# Patient Record
Sex: Female | Born: 1945
Health system: Southern US, Community
[De-identification: ages and names within clinical notes are randomized; demographics above are authoritative.]

## PROBLEM LIST (undated history)

## (undated) DIAGNOSIS — H269 Unspecified cataract: Secondary | ICD-10-CM

## (undated) DIAGNOSIS — K219 Gastro-esophageal reflux disease without esophagitis: Secondary | ICD-10-CM

## (undated) DIAGNOSIS — K635 Polyp of colon: Secondary | ICD-10-CM

## (undated) DIAGNOSIS — M1990 Unspecified osteoarthritis, unspecified site: Secondary | ICD-10-CM

## (undated) DIAGNOSIS — B029 Zoster without complications: Secondary | ICD-10-CM

## (undated) DIAGNOSIS — K449 Diaphragmatic hernia without obstruction or gangrene: Secondary | ICD-10-CM

## (undated) DIAGNOSIS — K5790 Diverticulosis of intestine, part unspecified, without perforation or abscess without bleeding: Secondary | ICD-10-CM

## (undated) DIAGNOSIS — E782 Mixed hyperlipidemia: Secondary | ICD-10-CM

## (undated) DIAGNOSIS — T451X5A Adverse effect of antineoplastic and immunosuppressive drugs, initial encounter: Secondary | ICD-10-CM

## (undated) DIAGNOSIS — C801 Malignant (primary) neoplasm, unspecified: Secondary | ICD-10-CM

## (undated) HISTORY — DX: Gastro-esophageal reflux disease without esophagitis: K21.9

## (undated) HISTORY — PX: JOINT REPLACEMENT: SHX530

## (undated) HISTORY — DX: Mixed hyperlipidemia: E78.2

## (undated) HISTORY — PX: KNEE SURGERY: SHX244

## (undated) HISTORY — DX: Malignant (primary) neoplasm, unspecified: C80.1

## (undated) HISTORY — PX: TUBAL LIGATION: SHX77

## (undated) HISTORY — DX: Polyp of colon: K63.5

## (undated) HISTORY — DX: Diaphragmatic hernia without obstruction or gangrene: K44.9

## (undated) HISTORY — DX: Adverse effect of antineoplastic and immunosuppressive drugs, initial encounter: T45.1X5A

## (undated) HISTORY — DX: Unspecified cataract: H26.9

## (undated) HISTORY — DX: Unspecified osteoarthritis, unspecified site: M19.90

## (undated) HISTORY — DX: Zoster without complications: B02.9

## (undated) HISTORY — DX: Diverticulosis of intestine, part unspecified, without perforation or abscess without bleeding: K57.90

---

## 1987-12-26 DIAGNOSIS — Z853 Personal history of malignant neoplasm of breast: Secondary | ICD-10-CM

## 1987-12-26 DIAGNOSIS — C801 Malignant (primary) neoplasm, unspecified: Secondary | ICD-10-CM

## 1987-12-26 HISTORY — PX: BREAST SURGERY: SHX581

## 1987-12-26 HISTORY — DX: Malignant (primary) neoplasm, unspecified: C80.1

## 1987-12-26 HISTORY — DX: Personal history of malignant neoplasm of breast: Z85.3

## 1992-12-25 HISTORY — PX: OTHER SURGICAL HISTORY: SHX169

## 1999-05-06 ENCOUNTER — Ambulatory Visit (HOSPITAL_COMMUNITY): Admission: RE | Admit: 1999-05-06 | Discharge: 1999-05-06 | Payer: Self-pay | Admitting: Hematology & Oncology

## 1999-05-06 ENCOUNTER — Encounter: Payer: Self-pay | Admitting: Hematology & Oncology

## 1999-10-19 ENCOUNTER — Other Ambulatory Visit: Admission: RE | Admit: 1999-10-19 | Discharge: 1999-10-19 | Payer: Self-pay | Admitting: Obstetrics and Gynecology

## 2000-11-13 ENCOUNTER — Other Ambulatory Visit: Admission: RE | Admit: 2000-11-13 | Discharge: 2000-11-13 | Payer: Self-pay | Admitting: Obstetrics and Gynecology

## 2001-07-29 ENCOUNTER — Ambulatory Visit (HOSPITAL_COMMUNITY): Admission: RE | Admit: 2001-07-29 | Discharge: 2001-07-29 | Payer: Self-pay | Admitting: Gastroenterology

## 2001-07-29 ENCOUNTER — Encounter (INDEPENDENT_AMBULATORY_CARE_PROVIDER_SITE_OTHER): Payer: Self-pay | Admitting: Specialist

## 2001-11-24 DIAGNOSIS — K635 Polyp of colon: Secondary | ICD-10-CM

## 2001-11-24 HISTORY — DX: Polyp of colon: K63.5

## 2002-01-17 ENCOUNTER — Other Ambulatory Visit: Admission: RE | Admit: 2002-01-17 | Discharge: 2002-01-17 | Payer: Self-pay | Admitting: Obstetrics and Gynecology

## 2003-02-10 ENCOUNTER — Other Ambulatory Visit: Admission: RE | Admit: 2003-02-10 | Discharge: 2003-02-10 | Payer: Self-pay | Admitting: Obstetrics and Gynecology

## 2004-03-02 ENCOUNTER — Other Ambulatory Visit: Admission: RE | Admit: 2004-03-02 | Discharge: 2004-03-02 | Payer: Self-pay | Admitting: Obstetrics and Gynecology

## 2004-12-21 ENCOUNTER — Ambulatory Visit (HOSPITAL_BASED_OUTPATIENT_CLINIC_OR_DEPARTMENT_OTHER): Admission: RE | Admit: 2004-12-21 | Discharge: 2004-12-21 | Payer: Self-pay | Admitting: Orthopedic Surgery

## 2004-12-21 ENCOUNTER — Ambulatory Visit (HOSPITAL_COMMUNITY): Admission: RE | Admit: 2004-12-21 | Discharge: 2004-12-21 | Payer: Self-pay | Admitting: Orthopedic Surgery

## 2005-01-25 HISTORY — PX: OTHER SURGICAL HISTORY: SHX169

## 2005-02-02 ENCOUNTER — Ambulatory Visit (HOSPITAL_BASED_OUTPATIENT_CLINIC_OR_DEPARTMENT_OTHER): Admission: RE | Admit: 2005-02-02 | Discharge: 2005-02-02 | Payer: Self-pay | Admitting: Orthopedic Surgery

## 2005-03-08 ENCOUNTER — Other Ambulatory Visit: Admission: RE | Admit: 2005-03-08 | Discharge: 2005-03-08 | Payer: Self-pay | Admitting: Obstetrics and Gynecology

## 2005-12-25 DIAGNOSIS — K5790 Diverticulosis of intestine, part unspecified, without perforation or abscess without bleeding: Secondary | ICD-10-CM

## 2005-12-25 HISTORY — DX: Diverticulosis of intestine, part unspecified, without perforation or abscess without bleeding: K57.90

## 2005-12-25 HISTORY — PX: COLONOSCOPY: SHX174

## 2005-12-25 LAB — HM COLONOSCOPY

## 2006-04-09 ENCOUNTER — Other Ambulatory Visit: Admission: RE | Admit: 2006-04-09 | Discharge: 2006-04-09 | Payer: Self-pay | Admitting: Obstetrics and Gynecology

## 2008-08-25 HISTORY — PX: ROTATOR CUFF REPAIR: SHX139

## 2008-09-01 ENCOUNTER — Other Ambulatory Visit: Admission: RE | Admit: 2008-09-01 | Discharge: 2008-09-01 | Payer: Self-pay | Admitting: Obstetrics and Gynecology

## 2008-09-01 LAB — HM PAP SMEAR: HM Pap smear: NORMAL

## 2008-11-24 HISTORY — PX: REPLACEMENT TOTAL KNEE: SUR1224

## 2008-12-21 ENCOUNTER — Inpatient Hospital Stay (HOSPITAL_COMMUNITY): Admission: RE | Admit: 2008-12-21 | Discharge: 2008-12-23 | Payer: Self-pay | Admitting: Orthopedic Surgery

## 2010-12-18 IMAGING — CR DG CHEST 2V
2 series · 2 of 2 positions shown · non-contrast
Comparison: None

CLINICAL DATA: Preadmission radiograph

CHEST - 2 VIEW

[view not recorded (1 of 2)]
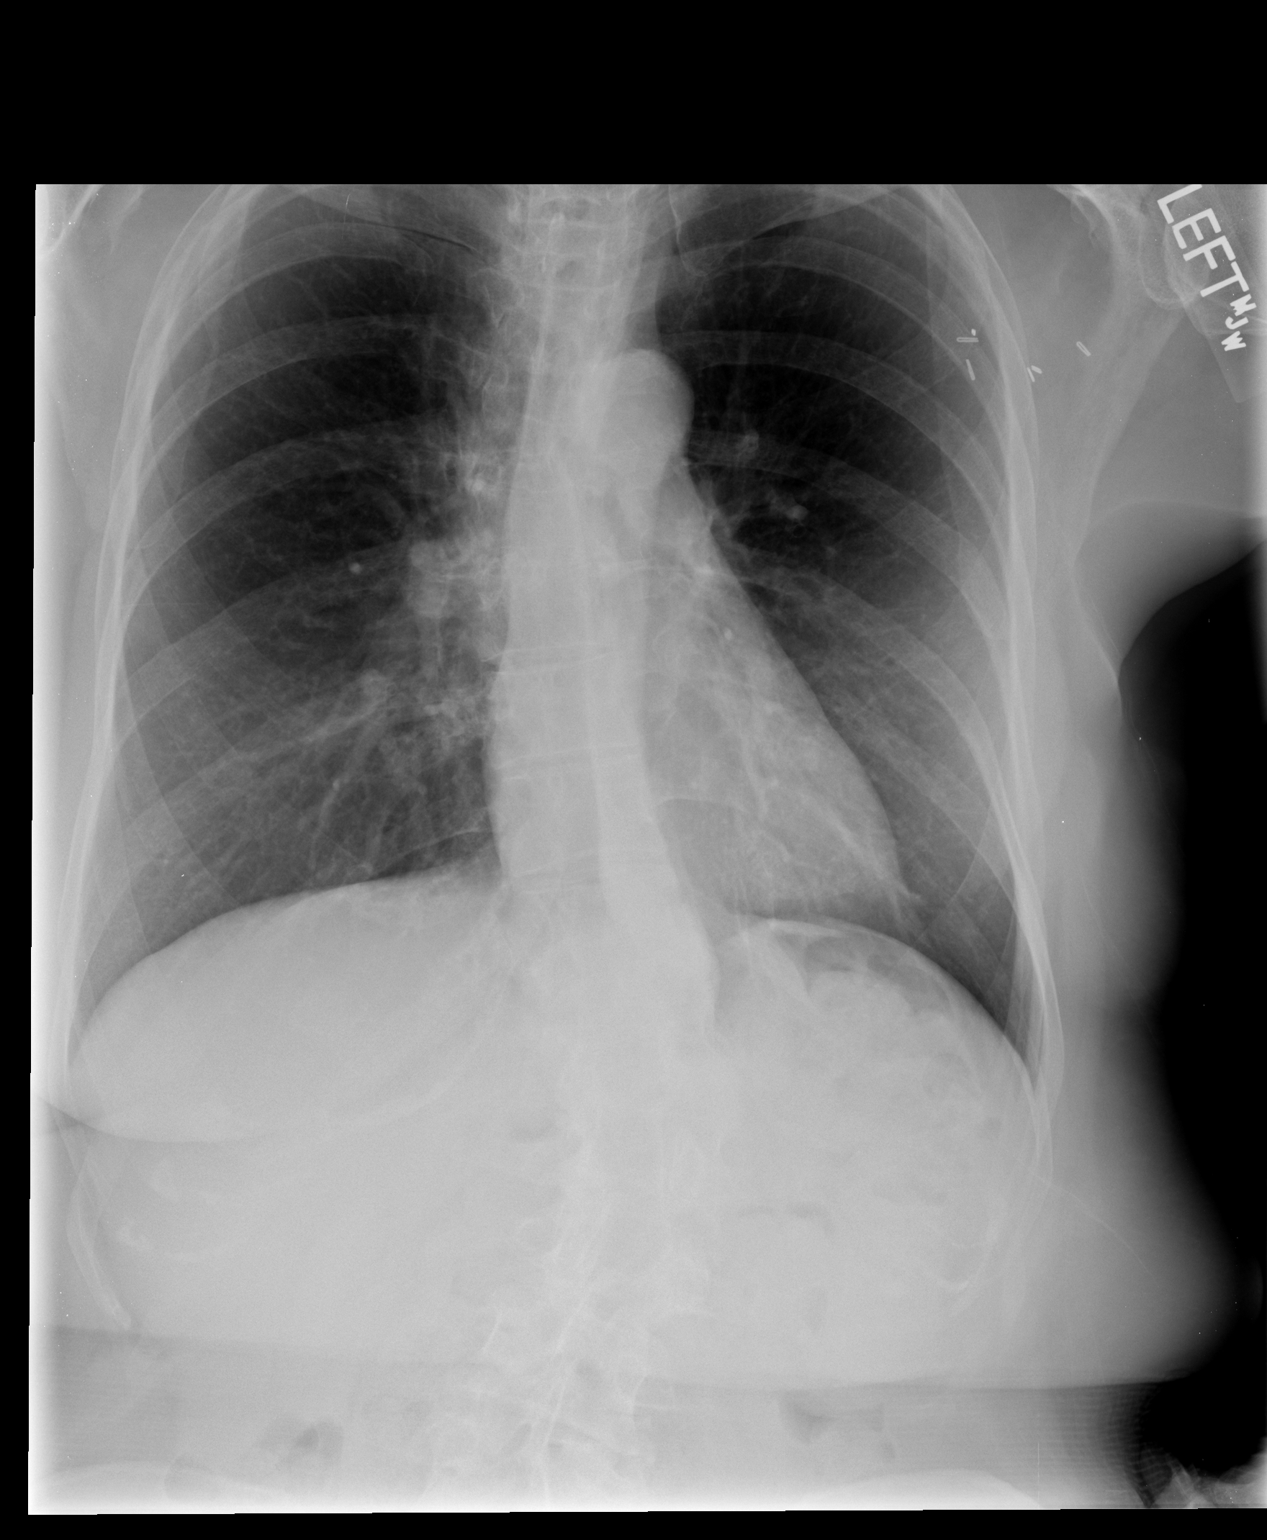

[view not recorded (2 of 2)]
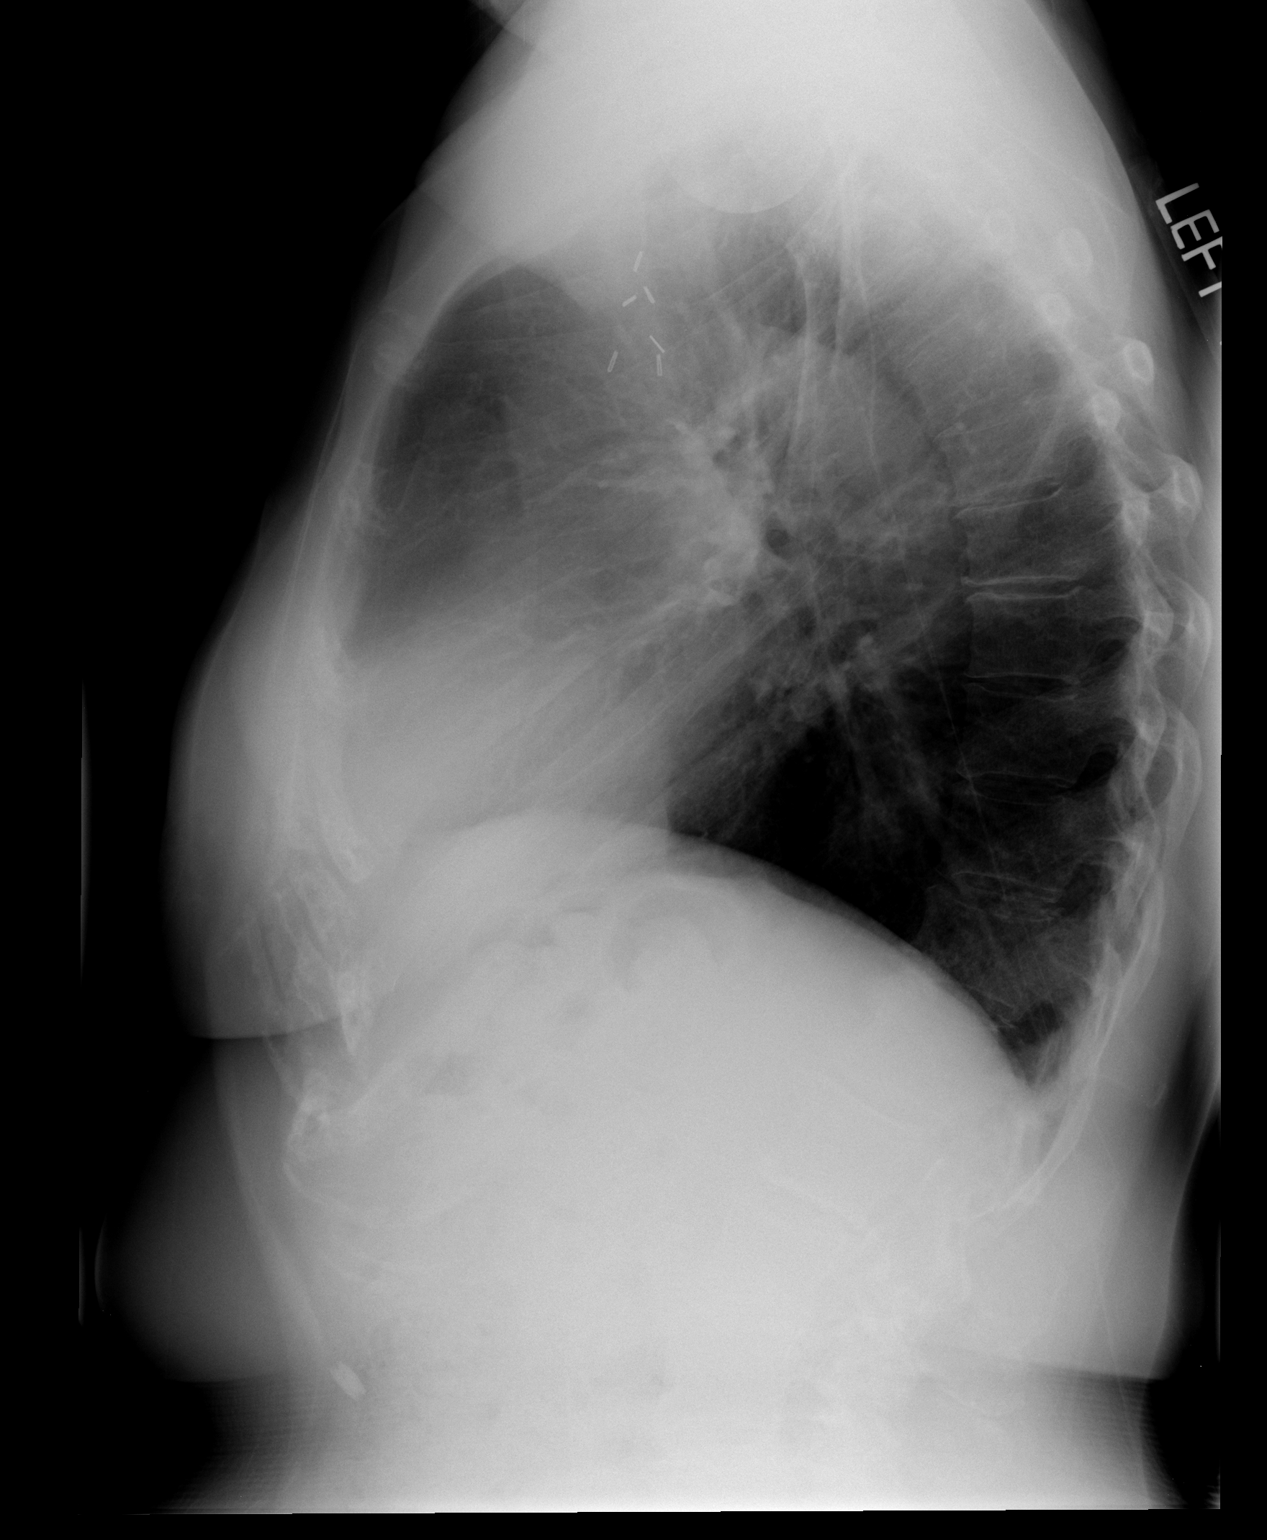

[2 of 2 positions shown; findings below may reference images not displayed]

FINDINGS: The heart size and mediastinal contours are within normal
limits.  Both lungs are clear.  The visualized skeletal structures
are unremarkable.
IMPRESSION: No acute cardiopulmonary abnormalities.

## 2011-05-09 NOTE — Op Note (Signed)
NAMEBRYNLIE, DAZA                ACCOUNT NO.:  [id number]   MEDICAL RECORD NO.:  [id number]          PATIENT TYPE:  INP   LOCATION:  5022                         FACILITY:  [facility]   PHYSICIAN:  Mila Homer. Sherlean Foot, M.D. DATE OF BIRTH:  [birth year]   DATE OF PROCEDURE:  12/21/2008  DATE OF DISCHARGE:                               OPERATIVE REPORT   SURGEON:  Mila Homer. Sherlean Foot, MD   ASSISTANT:  1. Altamese Cabal, PA-C  2. Laural Benes. Su Hilt, PA-C   ANESTHESIA:  General.   PREOPERATIVE DIAGNOSIS:  Right failed unicompartmental arthroplasty.   POSTOPERATIVE DIAGNOSIS:  Right failed unicompartmental arthroplasty.   PROCEDURE:  Right revision total knee arthroplasty.   INDICATIONS FOR PROCEDURE:  The patient is a 65 year old white female  status post UniSpacer.  Conservative measures failed.  Informed consent  was obtained.   DESCRIPTION OF PROCEDURE:  The patient was laid supine, administered  general anesthesia, and Foley catheter placed.  Right leg was prepped  and draped in usual sterile fashion.  The extremity was exsanguinated  with the Esmarch and elevated to 350 mmHg.  I then made a midline  incision using her old incision, it was a rather curvilinear incision  over the medial side of the patella.  New blade was used make a medial  parapatellar arthrotomy to perform synovectomy.  I elevated deep MCL off  the medial crest of the tibia to expose the knee.  I then everted the  patella measured 22 mm thick, I reamed down 8.5 mm in length through lug  holes and drilled, and then with the 32-mm template in place, I had  recreated the 22 mm thickness.  I then removed the trial component went  into flexion subluxing the patella laterally.  I used the extramedullary  alignment system on the tibia to make a perpendicular cut to the  anatomic axis of the tibia.  I then cut the ACL, PCL, and made an  intramedullary drill hole on the femur and cut the distal femur to 6  degrees of valgus.  I  marked out the epicondylar axis measured to a size  D, pinned through the 3-degree external rotation holes and made the  anterior, posterior, chamfer cuts.  I then removed the 4-in-1 cutting  block and cut surfaces of the bone.  I placed a lamina spreader in the  knee, removed the ACL, PCL, medial lateral menisci, and posterior  condylar osteophytes.  I then placed spacers in the knee and obtained  flexion/extension gap balance with a 14-spacer block.  I then finished  the femur with a size D finishing block, finished the tibia with size 4  tibial tray drilling keel.  I did have to bone graft the cyst in the  medial femoral condyle.  There was  approximately 2-3 mL of bone graft,  which was autogenous from the cut surfaces of the bone.  I then cemented  in the size D femur and size 4 tibia, removed all excess cement, snapped  in the 14 polyethylene insert and let the cement harden in extension.  Cemented in  the patella and removed excess cement.  We placed a Hemovac  coming out superolaterally with deep to the arthrotomy.  Pain catheter  coming out supermedial and superficial to the arthrotomy.  I injected  capsule with 4 cc of Marcaine 0.25%.  Then I let the tourniquet down and  obtained hemostasis after copiously irrigated.  I then closed the  arthrotomy with figure-of-eight #1 Vicryl sutures, buried 0 Vicryl  sutures, subcuticular 2-0 Vicryl, Steri-Strips, and skin staples.   DRESSINGS:  Xeroform dressing sponges, sterile Webril, TED stockings.   COMPLICATIONS:  None.   DRAINS:  One PEG catheter and one Hemovac.   ESTIMATED BLOOD LOSS:  300 mL.           ______________________________  Mila Homer. Sherlean Foot, M.D.     SDL/MEDQ  D:  12/21/2008  T:  12/21/2008  Job:  [id number]

## 2011-05-12 NOTE — Procedures (Signed)
[facility]  Patient:    Melanie Hamilton, Melanie Hamilton                       MRN: [id number] Proc. Date: 07/29/01 Adm. Date:  [id number] Attending:  Louie Bun                           Procedure Report  PROCEDURE:  Colonoscopy with polypectomy.  INDICATION FOR PROCEDURE:  History of adenomatous colon polyps with index colonoscopy three years ago.  DESCRIPTION OF PROCEDURE:  The patient was placed in the left lateral decubitus position and placed on the pulse monitor with continuous low-flow oxygen delivered by nasal cannula.  She was sedated with 25 mg IV Demerol and 2 mg IV Versed in addition to the medicine given for the EGD immediately before this procedure.  The Olympus video colonoscope was inserted into the rectum and advanced to the cecum, confirmed by transillumination at McBurneys point and visualization of the ileocecal valve and appendiceal orifice.  The prep was excellent.  The cecum, ascending, transverse, descending, and sigmoid colon all appeared normal with the exception of a small 6 mm sessile polyp of the mid ascending colon which was fulgurated by hot biopsy.  The rectum appeared normal, and retroflex view of the anus revealed no obvious internal hemorrhoids.  The colonoscope was then withdrawn, and the patient returned to the recovery room in stable condition.  She tolerated the procedure well, and there were no immediate complications.  IMPRESSION:  Small ascending colon polyp.  Otherwise normal colonoscopy.  PLAN:  Await histology and will probably perform a repeat colonoscopy in five years. DD:  07/29/01 TD:  07/29/01 Job: [id number]

## 2011-05-12 NOTE — Op Note (Signed)
NAMEZANOBIA, GRIEBEL                ACCOUNT NO.:  [id number]   MEDICAL RECORD NO.:  [id number]          PATIENT TYPE:  AMB   LOCATION:  DSC                          FACILITY:  [facility]   PHYSICIAN:  Nadara Mustard, MD     DATE OF BIRTH:  02/09/1946   DATE OF PROCEDURE:  02/02/2005  DATE OF DISCHARGE:                                 OPERATIVE REPORT   PREOPERATIVE DIAGNOSES:  1.  Severe hallux valgus deformity of the left great toe.  2.  Clawing of the left second toe with metatarsalgia and plantar callus.  3.  Plantar wart, left fifth metatarsal head.   PROCEDURES:  1.  Ludloff osteotomy, left great toe, first metatarsal.  2.  Weil osteotomy of the left second metatarsal.  3.  Aiken osteotomy, proximal phalanx, left great toe.  4.  Excision of plantar wart.   SURGEON:  Nadara Mustard, MD   ANESTHESIA:  Ankle block.   ESTIMATED BLOOD LOSS:  Minimal.   ANTIBIOTICS:  1 g Kefzol.   TOURNIQUET TIME:  Esmarch at the ankle for approximately 45 minutes.   DISPOSITION:  To PACU in stable condition.   INDICATION FOR PROCEDURE:  The patient is a 65 year old woman who has a  painful and severe hallux valgus deformity of the left great toe.  She has a  hallux valgus angle of 50 degrees with a noncongruent MTP joint as well as  an intermetatarsal angle of 15 degrees with overlapping of the second toe  over the great toe.  The patient has failed conservative care and presents  at this time for surgical intervention.  The risks and benefits were  discussed, including infection, neurovascular injury, persistent pain,  fracture of the bone, and need for additional surgery.  The patient states  she understands and wishes to proceed at this time.   DESCRIPTION OF PROCEDURE:  The patient underwent an ankle block and then was  brought to OR room 5.  After an adequate level of anesthesia obtained, the  patient's left lower extremity was prepped using DuraPrep and draped into a  sterile field.   Attention was first focused over the first metatarsal.  The  leg was elevated and an Esmarch was wrapped around the ankle for tourniquet  control.  A medial longitudinal incision was made over the first metatarsal.  This was carried down to the MTP joint.  A football shape of the retinaculum  was ellipsed out of the plantar aspect to allow for reduction of the  sesamoids.  An ostectomy was performed, and then a Ludloff ostectomy was  performed.  This was secured proximally with a 3.5 cortical screw in a lag  screw technique.  The deformity was corrected, and this was secured distally  with a second lag screw, a 3.5 cortical screw.  An ostectomy was performed  to remove the medial prominence of bone.  Attention was then focused over  the second metatarsal.  An incision was made over the second metatarsal head  MTP joint.  Sharp dissection was carried down to the joint.  Retractors were  placed.  A Weil osteotomy w performed, and this was stabilized with a 12 mm  spin screw.  Attention was focused in the first web space, and the adductor  tendons as well as the capsule were released to allow for reduction of the  sesamoids.  The tourniquet was deflated after 45 minutes, hemostasis was  obtained.  A 2-0 Vicryl was used to repair the retinaculum.  The alignment  of the toes showed there to still be some valgus deformity of the great toe,  and an Aiken osteotomy was performed over the proximal phalanx and this was  held stabilized with a 0.0625 K-wire.  This corrected her hallux valgus  deformity.  The retinaculum was closed using the 2-0 Vicryl.  The subcu was  closed using 2-0 Vicryl.  The skin was closed using a far-near, near-far  suture with 3-0 nylon.  The wound was covered with Adaptic.  Attention was  then focused over the fifth metatarsal head.  The plantar wart was excised  in one block of tissue.  This was then covered with Adaptic as well.  All  wounds were then covered with 4 x 4's,  Webril and a Coban dressing.  The  patient was taken to the PACU in stable condition.  She was comfortable.  She was discharged to home.  A prescription for Vicodin for pain, rest, ice,  elevation, nonweightbearing on the left lower extremity, follow-up in the  office on Friday to change the dressing, in two weeks for repeat evaluation.      MVD/MEDQ  D:  02/02/2005  T:  02/02/2005  Job:  [id number]

## 2011-05-12 NOTE — Procedures (Signed)
[facility]  Patient:    Melanie Hamilton, Melanie Hamilton                       MRN: [id number] Proc. Date: 07/29/01 Adm. Date:  [id number] Attending:  Louie Bun                           Procedure Report  PROCEDURE:  Esophagogastroduodenoscopy with biopsy.  INDICATION FOR PROCEDURE:  Patient scheduled for surveillance colonoscopy, has a long history of gastroesophageal reflux symptoms with recent increase in symptoms not adequately controlled on Prilosec.  DESCRIPTION OF PROCEDURE:   The patient was placed in the left lateral decubitus position and placed on the pulse monitor with continuous low-flow oxygen delivered by nasal cannula.  She was sedated with 75 mg IV Demerol and 6.5 mg IV Versed.  The Olympus video endoscope was advanced under direct vision into the oropharynx and esophagus.  The esophagus was somewhat tortuous but of normal caliber with the squamocolumnar line at 38 cm.  There were 3-4 discrete erosions with some exudate extending about 2 cm up from the Z-line consistent with grade 3 erosive esophagitis.  It was difficult to discern whether there was a ring or stricture, but none was apparent.  There was a 3 cm hiatal hernia distal to the squamocolumnar line.  The stomach was entered, and a small amount of liquid secretions were suctioned from the fundus.  Retroflex view of the cardia confirmed a hiatal hernia and was otherwise unremarkable.  The fundus, body, antrum, and pylorus all appeared normal.  The duodenum was entered, and both the bulb and the second portion were well inspected and appeared to be within normal limits.  The scope was then withdrawn, and the patient returned to the recovery room in stable condition.  She tolerated the procedure well, and there were no immediate complications.  IMPRESSION:  Persistent erosive esophagitis with hiatal hernia.  PLAN:  Will change from Prilosec to Nexium 40 mg b.i.d. for two months and then  daily. DD:  07/29/01 TD:  07/29/01 Job: [id number]

## 2011-09-29 LAB — URINE CULTURE

## 2011-09-29 LAB — COMPREHENSIVE METABOLIC PANEL
ALT: 18 U/L (ref 0–35)
AST: 20 U/L (ref 0–37)
Albumin: 4.1 g/dL (ref 3.5–5.2)
Alkaline Phosphatase: 101 U/L (ref 39–117)
BUN: 19 mg/dL (ref 6–23)
CO2: 25 mEq/L (ref 19–32)
Calcium: 9.8 mg/dL (ref 8.4–10.5)
Chloride: 103 mEq/L (ref 96–112)
Creatinine, Ser: 0.77 mg/dL (ref 0.4–1.2)
GFR calc Af Amer: >60 mL/min (ref >60)
GFR calc non Af Amer: >60 mL/min (ref >60)
Glucose, Bld: 97 mg/dL (ref 70–99)
Potassium: 4.1 mEq/L (ref 3.5–5.1)
Sodium: 137 mEq/L (ref 135–145)
Total Bilirubin: 0.7 mg/dL (ref 0.3–1.2)
Total Protein: 6.5 g/dL (ref 6.0–8.3)

## 2011-09-29 LAB — CBC
HCT: 40.7 % (ref 36.0–46.0)
Hemoglobin: 13.8 g/dL (ref 12.0–15.0)
Hemoglobin: 9.3 g/dL — ABNORMAL LOW (ref 12.0–15.0)
MCHC: 34 g/dL (ref 30.0–36.0)
MCHC: 34.1 g/dL (ref 30.0–36.0)
MCV: 94.3 fL (ref 78.0–100.0)
Platelets: 205 10*3/uL (ref 150–400)
Platelets: 252 10*3/uL (ref 150–400)
RBC: 2.85 MIL/uL — ABNORMAL LOW (ref 3.87–5.11)
RBC: 3.16 MIL/uL — ABNORMAL LOW (ref 3.87–5.11)
RBC: 4.32 MIL/uL (ref 3.87–5.11)
RDW: 12.2 % (ref 11.5–15.5)
RDW: 12.2 % (ref 11.5–15.5)
WBC: 4.5 10*3/uL (ref 4.0–10.5)

## 2011-09-29 LAB — DIFFERENTIAL
Basophils Absolute: 0 10*3/uL (ref 0.0–0.1)
Basophils Relative: 0 % (ref 0–1)
Eosinophils Absolute: 0.1 10*3/uL (ref 0.0–0.7)
Eosinophils Relative: 2 % (ref 0–5)
Lymphocytes Relative: 34 % (ref 12–46)
Lymphs Abs: 1.5 10*3/uL (ref 0.7–4.0)
Monocytes Absolute: 0.4 10*3/uL (ref 0.1–1.0)
Monocytes Relative: 9 % (ref 3–12)
Neutro Abs: 2.5 10*3/uL (ref 1.7–7.7)
Neutrophils Relative %: 54 % (ref 43–77)

## 2011-09-29 LAB — CROSSMATCH: ABO/RH(D): O POS

## 2011-09-29 LAB — BASIC METABOLIC PANEL
BUN: 8 mg/dL (ref 6–23)
CO2: 25 mEq/L (ref 19–32)
Calcium: 8.2 mg/dL — ABNORMAL LOW (ref 8.4–10.5)
Calcium: 8.5 mg/dL (ref 8.4–10.5)
Creatinine, Ser: 0.89 mg/dL (ref 0.4–1.2)
GFR calc Af Amer: >60 mL/min (ref >60)
GFR calc Af Amer: >60 mL/min (ref >60)
GFR calc non Af Amer: >60 mL/min (ref >60)
Glucose, Bld: 103 mg/dL — ABNORMAL HIGH (ref 70–99)
Sodium: 136 mEq/L (ref 135–145)

## 2011-09-29 LAB — URINALYSIS, ROUTINE W REFLEX MICROSCOPIC
Hgb urine dipstick: NEGATIVE
Protein, ur: NEGATIVE mg/dL
Urobilinogen, UA: 0.2 mg/dL (ref 0.0–1.0)

## 2011-09-29 LAB — URINE MICROSCOPIC-ADD ON

## 2011-09-29 LAB — PROTIME-INR
INR: 1 (ref 0.00–1.49)
Prothrombin Time: 13.6 seconds (ref 11.6–15.2)

## 2012-10-28 DIAGNOSIS — Z1231 Encounter for screening mammogram for malignant neoplasm of breast: Secondary | ICD-10-CM | POA: Diagnosis not present

## 2012-10-28 LAB — HM MAMMOGRAPHY: HM Mammogram: NORMAL

## 2013-01-16 DIAGNOSIS — Z23 Encounter for immunization: Secondary | ICD-10-CM | POA: Diagnosis not present

## 2013-05-14 DIAGNOSIS — B029 Zoster without complications: Secondary | ICD-10-CM | POA: Diagnosis not present

## 2013-05-23 DIAGNOSIS — E663 Overweight: Secondary | ICD-10-CM | POA: Diagnosis not present

## 2013-05-23 DIAGNOSIS — K219 Gastro-esophageal reflux disease without esophagitis: Secondary | ICD-10-CM | POA: Diagnosis not present

## 2013-05-23 DIAGNOSIS — Z13220 Encounter for screening for lipoid disorders: Secondary | ICD-10-CM | POA: Diagnosis not present

## 2013-05-23 DIAGNOSIS — Z79899 Other long term (current) drug therapy: Secondary | ICD-10-CM | POA: Diagnosis not present

## 2013-05-23 DIAGNOSIS — B028 Zoster with other complications: Secondary | ICD-10-CM | POA: Diagnosis not present

## 2013-05-26 ENCOUNTER — Encounter: Payer: Self-pay | Admitting: *Deleted

## 2013-05-27 ENCOUNTER — Encounter: Payer: Self-pay | Admitting: Nurse Practitioner

## 2013-05-27 ENCOUNTER — Ambulatory Visit (INDEPENDENT_AMBULATORY_CARE_PROVIDER_SITE_OTHER): Payer: Medicare Other | Admitting: Nurse Practitioner

## 2013-05-27 VITALS — BP 132/68 | HR 74 | Ht 61.0 in | Wt 190.0 lb

## 2013-05-27 DIAGNOSIS — Z124 Encounter for screening for malignant neoplasm of cervix: Secondary | ICD-10-CM

## 2013-05-27 DIAGNOSIS — C50919 Malignant neoplasm of unspecified site of unspecified female breast: Secondary | ICD-10-CM | POA: Diagnosis not present

## 2013-05-27 DIAGNOSIS — Z01419 Encounter for gynecological examination (general) (routine) without abnormal findings: Secondary | ICD-10-CM | POA: Diagnosis not present

## 2013-05-27 DIAGNOSIS — C50912 Malignant neoplasm of unspecified site of left female breast: Secondary | ICD-10-CM | POA: Insufficient documentation

## 2013-05-27 DIAGNOSIS — Z1239 Encounter for other screening for malignant neoplasm of breast: Secondary | ICD-10-CM | POA: Diagnosis not present

## 2013-05-27 MED ORDER — RALOXIFENE HCL 60 MG PO TABS: 60.0000 mg | ORAL_TABLET | Freq: Every day | ORAL | Status: DC

## 2013-05-27 NOTE — Progress Notes (Signed)
67 y.o. G2P2 Married Caucasian Fe here for annual exam.  Several concerns today: here insurance does not cover OV today so had to pay out of pocket, also her history of breast cancer she feels more comfortable with [place] checking her than PCP.   Currently off Evista for several months secondary to cost, now that she is on Medicare hopes that it will pay for med's. - needs new RX.  New onset of  Shingles diagnosed on May 20 th. On Valtrex, still has pain at night laying on her right side.  She also needs a new RX for prosthetic bra, she normally gets fitted at the [facility]. It has been awhile since last fitting and the bras have worn out.  Husband having several health issues and had to have abdominal surgery and has 2 fistulas that are draining and has to wear a drainage bag daily and be changed 2 times a day.  Patient's last menstrual period was 12/26/1987.          Sexually active: no  The current method of family planning is post menopausal status.    Exercising: yes  bike, walk and yard work.  Smoker:  no  Health Maintenance: Pap:  09/01/2008  Normal  MMG:  10/28/2012 normal Colonoscopy:  2007 diverticula recheck in 5 years -  is due now and will check on Medicare coverage. BMD:   2009 TDaP:  2010 Labs: PCP does lab (blood) work.    reports that she has never smoked. She has never used smokeless tobacco. She reports that she does not drink alcohol or use illicit drugs.  Past Medical History  Diagnosis Date  . Cancer 1989    breast cancer/ chemo therapy  . Chemotherapy adverse reaction     breast cancer-1989  . GERD (gastroesophageal reflux disease)   . Osteoarthritis   . Hyperplastic colon polyp 11/2001  . Hiatal hernia   . Diverticulosis 2007  . Shingles     Past Surgical History  Procedure Laterality Date  . Breast surgery  1989    reconstruction   . Carpel tunnel release  1994  . Knee surgery Right   . Bunion removed  01/2005  . Rotator cuff repair  08/2008  .  Replacement total knee Right 11/2008  . Colonoscopy  2007    Current Outpatient Prescriptions  Medication Sig Dispense Refill  . aspirin 81 MG tablet Take 81 mg by mouth daily.      . calcium carbonate (OS-CAL) 600 MG TABS Take 600 mg by mouth daily.      . fish oil-omega-3 fatty acids 1000 MG capsule Take 2 g by mouth daily.      [name] gabapentin (NEURONTIN) 300 MG capsule       . Hydrocodone-APAP-Dietary Prod (HYDROCODONE-APAP-NUTRIT SUPP) 10-325 MG MISC       . ibuprofen (ADVIL,MOTRIN) 100 MG tablet Take 100 mg by mouth every 6 (six) hours as needed for fever.      . naproxen sodium (ANAPROX) 220 MG tablet Take 220 mg by mouth 2 (two) times daily with a meal.      . PROTONIX 40 MG injection 40 mg.      . VALTREX 1 G tablet        No current facility-administered medications for this visit.    Family History  Problem Relation Age of Onset  . Cancer Brother     colon cancer    ROS:  Pertinent items are noted in HPI.  Otherwise, a comprehensive ROS was negative.  Exam:   BP 132/68  Pulse 74  Ht 5\' 1"  (1.549 m)  Wt 190 lb (86.183 kg)  BMI 35.92 kg/m2  LMP 12/26/1987 Height: 5\' 1"  (154.9 cm)  Ht Readings from Last 3 Encounters:  05/27/13 5\' 1"  (1.549 m)    General appearance: alert, cooperative and appears stated age Head: Normocephalic, without obvious abnormality, atraumatic Neck: no adenopathy, supple, symmetrical, trachea midline and thyroid normal to inspection and palpation Lungs: clear to auscultation bilaterally Breasts:right side normal appearance, no masses or tenderness, positive findings: left mastectomy with reconstruction with out mass. Heart: regular rate and rhythm Abdomen: soft, non-tender; no masses,  no organomegaly. Rash that is drying shingles right torso and right mid back.. Extremities: extremities normal, atraumatic, no cyanosis or edema Skin: Skin color, texture, turgor normal. No rashes or lesions Lymph nodes: Cervical, supraclavicular, and axillary  nodes normal. No abnormal inguinal nodes palpated Neurologic: Grossly normal   Pelvic: External genitalia:  no lesions              Urethra:  normal appearing urethra with no masses, tenderness or lesions              Bartholin's and Skene's: normal                 Vagina: normal appearing vagina with normal color and discharge, no lesions              Cervix: anteverted              Pap taken: no Bimanual Exam:  Uterus:  normal size, contour, position, consistency, mobility, non-tender              Adnexa: no mass, fullness, tenderness               Rectovaginal: Confirms               Anus:  normal sphincter tone, no lesions  A:  Well Woman with normal exam  S/P left mastectomy secondary to breast cancer 1989 with + nodes, chemo and Tamoxifen, now Evista  Postmenopausal  Osteoarthritis  Diverticulosis  [facility]: + colon cancer  P:   Pap smear as per guidelines   Mammogram due 11/14  Refill Evista for a year  Patient will schedule coloscopy if insurance will cover  Note given to patient for a prosthetic bra that she gets fitted at [facility]  Discussed rationale for Medicare guidelines, but patient is invited to return on yearly basis for her breast  Check - very nice patient whom I would hate to loose.  counseled on breast self exam, adequate intake of calcium and vitamin D,   diet and exercise, Kegel's exercises return annually or prn  An After Visit Summary was printed and given to the patient.

## 2013-05-27 NOTE — Patient Instructions (Addendum)

## 2013-05-28 NOTE — Progress Notes (Signed)
Reviewed personally.  MSM 

## 2013-06-13 DIAGNOSIS — E782 Mixed hyperlipidemia: Secondary | ICD-10-CM | POA: Diagnosis not present

## 2013-06-13 DIAGNOSIS — R112 Nausea with vomiting, unspecified: Secondary | ICD-10-CM | POA: Diagnosis not present

## 2013-06-13 DIAGNOSIS — B028 Zoster with other complications: Secondary | ICD-10-CM | POA: Diagnosis not present

## 2013-06-13 DIAGNOSIS — K219 Gastro-esophageal reflux disease without esophagitis: Secondary | ICD-10-CM | POA: Diagnosis not present

## 2013-07-07 DIAGNOSIS — Z86010 Personal history of colonic polyps: Secondary | ICD-10-CM | POA: Diagnosis not present

## 2013-07-07 DIAGNOSIS — K219 Gastro-esophageal reflux disease without esophagitis: Secondary | ICD-10-CM | POA: Diagnosis not present

## 2013-07-08 DIAGNOSIS — R112 Nausea with vomiting, unspecified: Secondary | ICD-10-CM | POA: Diagnosis not present

## 2013-07-08 DIAGNOSIS — K219 Gastro-esophageal reflux disease without esophagitis: Secondary | ICD-10-CM | POA: Diagnosis not present

## 2013-07-08 DIAGNOSIS — B0223 Postherpetic polyneuropathy: Secondary | ICD-10-CM | POA: Diagnosis not present

## 2013-07-08 DIAGNOSIS — E782 Mixed hyperlipidemia: Secondary | ICD-10-CM | POA: Diagnosis not present

## 2013-08-13 DIAGNOSIS — Z86010 Personal history of colonic polyps: Secondary | ICD-10-CM | POA: Diagnosis not present

## 2013-08-13 DIAGNOSIS — K5730 Diverticulosis of large intestine without perforation or abscess without bleeding: Secondary | ICD-10-CM | POA: Diagnosis not present

## 2013-08-13 DIAGNOSIS — Z09 Encounter for follow-up examination after completed treatment for conditions other than malignant neoplasm: Secondary | ICD-10-CM | POA: Diagnosis not present

## 2013-08-14 DIAGNOSIS — R933 Abnormal findings on diagnostic imaging of other parts of digestive tract: Secondary | ICD-10-CM | POA: Insufficient documentation

## 2013-09-10 DIAGNOSIS — Z23 Encounter for immunization: Secondary | ICD-10-CM | POA: Diagnosis not present

## 2013-10-23 DIAGNOSIS — E7800 Pure hypercholesterolemia, unspecified: Secondary | ICD-10-CM | POA: Diagnosis not present

## 2013-10-23 DIAGNOSIS — Z23 Encounter for immunization: Secondary | ICD-10-CM | POA: Diagnosis not present

## 2013-10-23 DIAGNOSIS — Z79899 Other long term (current) drug therapy: Secondary | ICD-10-CM | POA: Diagnosis not present

## 2013-10-23 DIAGNOSIS — Z13820 Encounter for screening for osteoporosis: Secondary | ICD-10-CM | POA: Diagnosis not present

## 2013-10-23 DIAGNOSIS — E782 Mixed hyperlipidemia: Secondary | ICD-10-CM | POA: Diagnosis not present

## 2013-10-23 DIAGNOSIS — K219 Gastro-esophageal reflux disease without esophagitis: Secondary | ICD-10-CM | POA: Diagnosis not present

## 2013-10-29 DIAGNOSIS — M899 Disorder of bone, unspecified: Secondary | ICD-10-CM | POA: Diagnosis not present

## 2013-10-29 DIAGNOSIS — Z1231 Encounter for screening mammogram for malignant neoplasm of breast: Secondary | ICD-10-CM | POA: Diagnosis not present

## 2013-10-29 DIAGNOSIS — Z8262 Family history of osteoporosis: Secondary | ICD-10-CM | POA: Diagnosis not present

## 2013-11-05 ENCOUNTER — Telehealth: Payer: Self-pay | Admitting: *Deleted

## 2013-11-05 NOTE — Telephone Encounter (Signed)
Patient is returning [employer].

## 2013-11-05 NOTE — Telephone Encounter (Signed)
I have attempted to contact this patient by phone with the following results: message left to return my call with female at home number.

## 2013-11-06 NOTE — Telephone Encounter (Signed)
S/w patient notified her of BMD results, patient wants to know if there are any other medications she can use that's cheaper than the evista since it's so expensive.  Please advise.

## 2013-11-25 NOTE — Telephone Encounter (Signed)
Pt notified per Clayborne Dana, that no alternative like Evista and pt's bone density report is not bad enough for Fosamax or Actonel.  Pt states she has new RX plan that will start in the new year that should pay better.  She will have filled at that time and will have pharmacy contact us when she needs refills.

## 2014-02-02 DIAGNOSIS — K219 Gastro-esophageal reflux disease without esophagitis: Secondary | ICD-10-CM | POA: Diagnosis not present

## 2014-02-02 DIAGNOSIS — E782 Mixed hyperlipidemia: Secondary | ICD-10-CM | POA: Diagnosis not present

## 2014-02-02 DIAGNOSIS — E7800 Pure hypercholesterolemia, unspecified: Secondary | ICD-10-CM | POA: Diagnosis not present

## 2014-02-02 DIAGNOSIS — M545 Low back pain, unspecified: Secondary | ICD-10-CM | POA: Diagnosis not present

## 2014-02-02 DIAGNOSIS — Z79899 Other long term (current) drug therapy: Secondary | ICD-10-CM | POA: Diagnosis not present

## 2014-02-02 DIAGNOSIS — M25519 Pain in unspecified shoulder: Secondary | ICD-10-CM | POA: Diagnosis not present

## 2014-03-04 DIAGNOSIS — H2510 Age-related nuclear cataract, unspecified eye: Secondary | ICD-10-CM | POA: Diagnosis not present

## 2014-05-11 DIAGNOSIS — K219 Gastro-esophageal reflux disease without esophagitis: Secondary | ICD-10-CM | POA: Diagnosis not present

## 2014-05-11 DIAGNOSIS — E7800 Pure hypercholesterolemia, unspecified: Secondary | ICD-10-CM | POA: Diagnosis not present

## 2014-05-11 DIAGNOSIS — E782 Mixed hyperlipidemia: Secondary | ICD-10-CM | POA: Diagnosis not present

## 2014-05-11 DIAGNOSIS — Z79899 Other long term (current) drug therapy: Secondary | ICD-10-CM | POA: Diagnosis not present

## 2014-05-11 DIAGNOSIS — M25519 Pain in unspecified shoulder: Secondary | ICD-10-CM | POA: Diagnosis not present

## 2014-06-29 ENCOUNTER — Other Ambulatory Visit: Payer: Self-pay | Admitting: Nurse Practitioner

## 2014-06-29 NOTE — Telephone Encounter (Signed)
Called patient to try and schedule AEX patient says medicare probably wouldn't cover her AEX this year. They only cover q 2-3 years. Patient says that Ms. Patty said she could come in but for some other reason.  Please advise Ms. Patty does patient still need to schedule appointment?

## 2014-06-29 NOTE — Telephone Encounter (Signed)
Per Ms. Patty patient can have #90 Days and when she comes in for appointment she can have refill x 1 year.  Evista #90/0 refills sent to pharmacy also notified patient that she will get rx for bra when she comes in for appointment and refills x 1 year next week patient aware.  Routed to provider for review, encounter closed.

## 2014-06-29 NOTE — Telephone Encounter (Signed)
Scheduled patient for breast recheck for 07/08/14 @ 10:30  Patient also wants to know if she can get a new prescription for prosthesis for her bra she says that Ms. Patty gave her a rx last year but she lost it before she could even get it filled  Please advise.

## 2014-06-29 NOTE — Telephone Encounter (Signed)
Yes her RX for bra wear can be given at same time.

## 2014-06-29 NOTE — Telephone Encounter (Signed)
Have patient to schedule OV for breast check because of her history of breast cancer

## 2014-07-08 ENCOUNTER — Ambulatory Visit (INDEPENDENT_AMBULATORY_CARE_PROVIDER_SITE_OTHER): Payer: Medicare Other | Admitting: Nurse Practitioner

## 2014-07-08 ENCOUNTER — Encounter: Payer: Self-pay | Admitting: Nurse Practitioner

## 2014-07-08 VITALS — BP 126/74 | HR 68 | Ht 60.5 in | Wt 187.0 lb

## 2014-07-08 DIAGNOSIS — C50919 Malignant neoplasm of unspecified site of unspecified female breast: Secondary | ICD-10-CM | POA: Diagnosis not present

## 2014-07-08 DIAGNOSIS — C50912 Malignant neoplasm of unspecified site of left female breast: Secondary | ICD-10-CM

## 2014-07-08 MED ORDER — RALOXIFENE HCL 60 MG PO TABS: ORAL_TABLET | ORAL | Status: DC

## 2014-07-08 NOTE — Patient Instructions (Signed)

## 2014-07-08 NOTE — Progress Notes (Signed)
Patient ID: Melanie Hamilton, female   DOB: 1946-12-22, 68 y.o.   MRN: [id number] 68 y.o. G2P2 Married Caucasian Fe here for breast exam. She is post left breast cancer 1989 with reconstruction.  She wears a prosthetic bra and that one is in need of replacement.  She comes in today as she prefers that we do her breast exams instead of PCP.  She needs a new RX for a new bra.  No new symptoms of breast discharge or pain.   Patient's last menstrual period was 12/26/1987.          Sexually active: no  The current method of family planning is post menopausal status.  Exercising: yes walk, mowing, climbing stairs and yard work.  Smoker: no   Health Maintenance:  Pap: 09/01/2008 Normal  MMG: 10/29/13, Negative  Colonoscopy: 2007 diverticula recheck in 5 years - is due now and will check on Medicare coverage.  BMD: 10/29/13, low bone mass TDaP: 2010  Labs: PCP does lab (blood) work.   reports that she has never smoked. She has never used smokeless tobacco. She reports that she does not drink alcohol or use illicit drugs.  Past Medical History  Diagnosis Date  . Chemotherapy adverse reaction     breast cancer-1989  . GERD (gastroesophageal reflux disease)   . Osteoarthritis   . Hyperplastic colon polyp 11/2001  . Hiatal hernia   . Diverticulosis 2007  . Shingles 5/20  . Cancer 1989    breast cancer/ chemo therapy/ mastectomy and reconstruction    Past Surgical History  Procedure Laterality Date  . Breast surgery  1989    reconstruction   . Carpel tunnel release  1994  . Knee surgery Right   . Bunion removed  01/2005  . Rotator cuff repair  08/2008  . Replacement total knee Right 11/2008  . Colonoscopy  2007    Current Outpatient Prescriptions  Medication Sig Dispense Refill  . aspirin 81 MG tablet Take 81 mg by mouth daily.      [name] atorvastatin (LIPITOR) 10 MG tablet Take 10 mg by mouth daily.      . calcium carbonate (OS-CAL) 600 MG TABS Take 600 mg by mouth daily.      .  cholecalciferol (VITAMIN D) 1000 UNITS tablet Take 1,000 Units by mouth daily.      [name] esomeprazole (NEXIUM) 40 MG capsule Take 40 mg by mouth daily at 12 noon.      . fish oil-omega-3 fatty acids 1000 MG capsule Take 2 g by mouth daily.      [name] ibuprofen (ADVIL,MOTRIN) 100 MG tablet Take 100 mg by mouth every 6 (six) hours as needed for fever.      . meloxicam (MOBIC) 7.5 MG tablet Take 7.5 mg by mouth daily.      . naproxen sodium (ANAPROX) 220 MG tablet Take 220 mg by mouth 2 (two) times daily with a meal.      . raloxifene (EVISTA) 60 MG tablet TAKE ONE TABLET BY MOUTH ONCE DAILY  90 tablet  3   No current facility-administered medications for this visit.    Family History  Problem Relation Age of Onset  . Cancer Brother     colon cancer  . Osteoarthritis Mother   . Hypertension Mother   . Heart failure Father   . Cancer Maternal Grandfather   . Heart failure Paternal Grandmother     ROS:  Pertinent items are noted in HPI.  Otherwise, a comprehensive ROS  was negative.  Exam:   BP 126/74  Pulse 68  Ht 5' 0.5" (1.537 m)  Wt 187 lb (84.823 kg)  BMI 35.91 kg/m2  LMP 12/26/1987 Height: 5' 0.5" (153.7 cm)  Ht Readings from Last 3 Encounters:  07/08/14 5' 0.5" (1.537 m)  05/27/13 5\' 1"  (1.549 m)    General appearance: alert, cooperative and appears stated age Head: Normocephalic, without obvious abnormality, atraumatic Neck: no adenopathy, supple, symmetrical, trachea midline and thyroid normal to inspection and palpation Lungs: clear to auscultation bilaterally Breasts: normal appearance, no masses or tenderness, leftbreast with surgical changes and partial mastectomy - no new areas of concern. Heart: regular rate and rhythm Abdomen: soft, non-tender; no masses,  no organomegaly Extremities: extremities normal, atraumatic, no cyanosis or edema Skin: Skin color, texture, turgor normal. No rashes or lesions Lymph nodes: Cervical, supraclavicular, and axillary nodes normal. No  abnormal inguinal nodes palpated Neurologic: Grossly normal   Pelvic: not indicated                 A:  Breast Exam for history of breast cancer with partial left mastectomy  P:   Reviewed health and wellness pertinent to exam  Refill on Evista daily  Mammogram is due 11/15  Rx given for prosthetic bra  Counseled on breast self exam, mammography screening, osteoporosis, adequate intake of calcium and vitamin D, diet and exercise, Kegel's exercises return annually or prn  An After Visit Summary was printed and given to the patient.

## 2014-07-09 NOTE — Progress Notes (Signed)
Note reviewed, agree with plan.  Darry Kelnhofer, MD  

## 2014-08-18 DIAGNOSIS — Z79899 Other long term (current) drug therapy: Secondary | ICD-10-CM | POA: Diagnosis not present

## 2014-08-18 DIAGNOSIS — M545 Low back pain, unspecified: Secondary | ICD-10-CM | POA: Diagnosis not present

## 2014-08-18 DIAGNOSIS — K219 Gastro-esophageal reflux disease without esophagitis: Secondary | ICD-10-CM | POA: Diagnosis not present

## 2014-08-18 DIAGNOSIS — E782 Mixed hyperlipidemia: Secondary | ICD-10-CM | POA: Diagnosis not present

## 2014-08-18 DIAGNOSIS — E7800 Pure hypercholesterolemia, unspecified: Secondary | ICD-10-CM | POA: Diagnosis not present

## 2014-09-15 DIAGNOSIS — Z23 Encounter for immunization: Secondary | ICD-10-CM | POA: Diagnosis not present

## 2014-10-26 ENCOUNTER — Encounter: Payer: Self-pay | Admitting: Nurse Practitioner

## 2014-11-02 DIAGNOSIS — Z853 Personal history of malignant neoplasm of breast: Secondary | ICD-10-CM | POA: Diagnosis not present

## 2014-11-02 DIAGNOSIS — Z1231 Encounter for screening mammogram for malignant neoplasm of breast: Secondary | ICD-10-CM | POA: Diagnosis not present

## 2014-11-03 ENCOUNTER — Telehealth: Payer: Self-pay | Admitting: Nurse Practitioner

## 2014-11-03 NOTE — Telephone Encounter (Signed)
Pt returning call. She would like a call back on her cell

## 2014-11-03 NOTE — Telephone Encounter (Signed)
Left patient a message to return my call about help in completing her [place].

## 2014-11-05 NOTE — Telephone Encounter (Signed)
Patient was called to get further information about mastectomy bra order from [facility].  We have completed the forms and will fax as directed.

## 2014-12-04 ENCOUNTER — Telehealth: Payer: Self-pay | Admitting: Nurse Practitioner

## 2014-12-04 NOTE — Telephone Encounter (Signed)
[facility] - Mastectomy supplies  Faxed rx and states Ed[place] Circle filled out and it was received back, but was missing length of need. Rachelle w/[facility] confirmed fax number and will refax form.  bf

## 2014-12-07 NOTE — Telephone Encounter (Signed)
Faxed form to [facility] Encounter closed no need to send to provider per Ms. Patty

## 2015-02-26 DIAGNOSIS — Z23 Encounter for immunization: Secondary | ICD-10-CM | POA: Diagnosis not present

## 2015-06-08 DIAGNOSIS — H2513 Age-related nuclear cataract, bilateral: Secondary | ICD-10-CM | POA: Diagnosis not present

## 2015-07-18 ENCOUNTER — Other Ambulatory Visit: Payer: Self-pay | Admitting: Nurse Practitioner

## 2015-07-19 NOTE — Telephone Encounter (Signed)
Called patient and scheduled her for breast check 07/26/15 with Ms. Patty and she is aware that rx has been sent it.

## 2015-07-19 NOTE — Telephone Encounter (Signed)
Yes we still do yearly breast checks for her with history of breast cancer - I can give RX for the entire year then but will go ahead and send enough for 3 months until she can get scheduled.

## 2015-07-19 NOTE — Telephone Encounter (Signed)
Medication refill request: Evista 60 mg  Last AEX:  05/27/2013 with PG  Next AEX: No AEX scheduled Last BMD: 12/02/2013  Refill authorized: ?  S/w patient she normally comes in for a office visit with Ms. Patty does patient still need to come in?

## 2015-07-26 ENCOUNTER — Ambulatory Visit: Payer: Medicare Other | Admitting: Nurse Practitioner

## 2015-08-24 DIAGNOSIS — M810 Age-related osteoporosis without current pathological fracture: Secondary | ICD-10-CM | POA: Diagnosis not present

## 2015-08-24 DIAGNOSIS — K219 Gastro-esophageal reflux disease without esophagitis: Secondary | ICD-10-CM | POA: Diagnosis not present

## 2015-08-24 DIAGNOSIS — E780 Pure hypercholesterolemia: Secondary | ICD-10-CM | POA: Diagnosis not present

## 2015-08-24 DIAGNOSIS — Z1211 Encounter for screening for malignant neoplasm of colon: Secondary | ICD-10-CM | POA: Diagnosis not present

## 2015-08-24 DIAGNOSIS — Z23 Encounter for immunization: Secondary | ICD-10-CM | POA: Diagnosis not present

## 2015-08-24 DIAGNOSIS — E782 Mixed hyperlipidemia: Secondary | ICD-10-CM | POA: Diagnosis not present

## 2015-08-24 DIAGNOSIS — M545 Low back pain: Secondary | ICD-10-CM | POA: Diagnosis not present

## 2015-08-24 DIAGNOSIS — R5383 Other fatigue: Secondary | ICD-10-CM | POA: Diagnosis not present

## 2015-08-27 ENCOUNTER — Ambulatory Visit: Payer: Medicare Other | Admitting: Nurse Practitioner

## 2015-09-28 DIAGNOSIS — Z1211 Encounter for screening for malignant neoplasm of colon: Secondary | ICD-10-CM | POA: Diagnosis not present

## 2015-10-04 ENCOUNTER — Ambulatory Visit: Payer: Medicare Other | Admitting: Nurse Practitioner

## 2015-10-20 ENCOUNTER — Encounter: Payer: Self-pay | Admitting: Nurse Practitioner

## 2015-10-20 ENCOUNTER — Ambulatory Visit (INDEPENDENT_AMBULATORY_CARE_PROVIDER_SITE_OTHER): Payer: Medicare Other | Admitting: Nurse Practitioner

## 2015-10-20 VITALS — BP 120/76 | HR 64 | Ht 60.5 in | Wt 193.0 lb

## 2015-10-20 DIAGNOSIS — C50912 Malignant neoplasm of unspecified site of left female breast: Secondary | ICD-10-CM

## 2015-10-20 MED ORDER — RALOXIFENE HCL 60 MG PO TABS: 60.0000 mg | ORAL_TABLET | Freq: Every day | ORAL | Status: DC

## 2015-10-20 NOTE — Progress Notes (Signed)
Patient ID: Melanie Hamilton, female   DOB: Nov 13, 1946, 69 y.o.   MRN: [id number] 69 y.o. G2P0002 Married  Caucasian Fe here for breast exam.  She is post left breast cancer 1989 with reconstruction. She wears a prosthetic bra and does not need a new RX at this time. She comes in today as she prefers that we do her breast exams instead of PCP. No new symptoms of breast discharge or pain. She has been having problems with low back pain without radiation to lower extremeities.  Because of her husbands health issues, she has to do all the yard work.  At times this causes increase in low back pain.  Some dyspnea with exertion.    Patient's last menstrual period was 12/26/1987.          Sexually active: No.  The current method of family planning is none.    Exercising: Yes.    pt does all yard work and push mowing Smoker:  no  Health Maintenance: Pap:  09/01/08, Negative  MMG: 11/02/14, Bi-Rads 1:  Negative Colonoscopy: 08/13/13, Normal, repeat in 5 years, Dr. Amedeo Plenty BMD: 12/02/13, T-Score: 0.7 Spine / -1.9 Right / -1.6 Left / -2.7 Radius TDaP: 2010 Shingles: 2015 Prevnar 13: 2015 Labs: PCP does lab (blood) work.   reports that she has never smoked. She has never used smokeless tobacco. She reports that she does not drink alcohol or use illicit drugs.  Past Medical History  Diagnosis Date  . Chemotherapy adverse reaction     breast cancer-1989  . GERD (gastroesophageal reflux disease)   . Osteoarthritis   . Hyperplastic colon polyp 11/2001  . Hiatal hernia   . Diverticulosis 2007  . Shingles 5/20  . Cancer [facility]) 1989    breast cancer/ chemo therapy/ mastectomy and reconstruction    Past Surgical History  Procedure Laterality Date  . Breast surgery  1989    reconstruction   . Carpel tunnel release  1994  . Knee surgery Right   . Bunion removed  01/2005  . Rotator cuff repair  08/2008  . Replacement total knee Right 11/2008  . Colonoscopy  2007    Current Outpatient Prescriptions   Medication Sig Dispense Refill  . omeprazole (PRILOSEC) 40 MG capsule Take 40 mg by mouth 2 (two) times daily.    [name] aspirin 81 MG tablet Take 81 mg by mouth daily.    [name] atorvastatin (LIPITOR) 10 MG tablet Take 10 mg by mouth daily.    . calcium carbonate (OS-CAL) 600 MG TABS Take 600 mg by mouth daily.    . cholecalciferol (VITAMIN D) 1000 UNITS tablet Take 1,000 Units by mouth daily.    . fish oil-omega-3 fatty acids 1000 MG capsule Take 2 g by mouth daily.    [name] ibuprofen (ADVIL,MOTRIN) 100 MG tablet Take 100 mg by mouth every 6 (six) hours as needed for fever.    . meloxicam (MOBIC) 7.5 MG tablet Take 7.5 mg by mouth daily.    . naproxen sodium (ANAPROX) 220 MG tablet Take 220 mg by mouth 2 (two) times daily with a meal.    . raloxifene (EVISTA) 60 MG tablet Take 1 tablet (60 mg total) by mouth daily. 90 tablet 4   No current facility-administered medications for this visit.    Family History  Problem Relation Age of Onset  . Cancer Brother     colon cancer  . Osteoarthritis Mother   . Hypertension Mother   . Heart failure Father   .  Cancer Maternal Grandfather   . Heart failure Paternal Grandmother     ROS:  Pertinent items are noted in HPI.  Otherwise, a comprehensive ROS was negative.  Exam:   BP 120/76 mmHg  Pulse 64  Ht 5' 0.5" (1.537 m)  Wt 193 lb (87.544 kg)  BMI 37.06 kg/m2  LMP 12/26/1987 Height: 5' 0.5" (153.7 cm) Ht Readings from Last 3 Encounters:  10/20/15 5' 0.5" (1.537 m)  07/08/14 5' 0.5" (1.537 m)  05/27/13 5\' 1"  (1.549 m)    General appearance: alert, cooperative and appears stated age Head: Normocephalic, without obvious abnormality, atraumatic Neck: no adenopathy, supple, symmetrical, trachea midline and thyroid normal to inspection and palpation Lungs: clear to auscultation bilaterally Breasts: normal appearance, no masses or tenderness, on the right.  On the left mastectomy with surgical changes. Heart: regular rate and rhythm Abdomen: soft,  non-tender; no masses,  no organomegaly Extremities: extremities normal, atraumatic, no cyanosis or edema Skin: Skin color, texture, turgor normal. No rashes or lesions Lymph nodes: Cervical, supraclavicular, and axillary nodes normal. No abnormal inguinal nodes palpated Neurologic: Grossly normal  Pelvic:  Not done as this is done by PCP.  A:  Well Woman with Breast Exam for history of breast cancer   S/P left mastectomy secondary to breast cancer 1989 with + nodes, chemo and Tamoxifen, now Evista Postmenopausal Osteoarthritis Diverticulosis [place]: + colon cancer   P:   Reviewed health and wellness pertinent to exam  Mammogram is due 10/2015  return annually or prn  An After Visit Summary was printed and given to the patient.

## 2015-10-20 NOTE — Patient Instructions (Addendum)

## 2015-10-24 NOTE — Progress Notes (Signed)
Encounter reviewed by Dr. Brook Amundson C. Silva.  

## 2015-12-29 DIAGNOSIS — Z853 Personal history of malignant neoplasm of breast: Secondary | ICD-10-CM | POA: Diagnosis not present

## 2015-12-29 DIAGNOSIS — Z1231 Encounter for screening mammogram for malignant neoplasm of breast: Secondary | ICD-10-CM | POA: Diagnosis not present

## 2015-12-29 DIAGNOSIS — M8589 Other specified disorders of bone density and structure, multiple sites: Secondary | ICD-10-CM | POA: Diagnosis not present

## 2016-01-11 ENCOUNTER — Telehealth: Payer: Self-pay | Admitting: Nurse Practitioner

## 2016-01-11 NOTE — Telephone Encounter (Signed)
Please let patient know that BMD shows a T Score at the right femur neck: -1.90; left femur neck: -1.60; left 1/3 radius -1.10.  The spine is not used due to degenerative changes. Her scores puts her in the Osteopenic range.   Comparison with last scan from 10/29/2013 shows an increase in BMD at both hips.  No prior comparison is made for the radius.    The FRAX score for major fracture in 10 yrs is 26% (goal is <20%; the FRAX score for hip fracture in 10 yrs is 3.7% (goal is <3%).  The Evista therapy is really helping to give her bones stability and I recommend that she continue.  She needs to do upper body weights with 2 lb weights.  She needs to do more walking - but on stable even ground.  No climbing on ladders or uneven ground as this puts her at risk for a fall. She needs to repeat BMD in 2 yrs. PCP also got a copy of BMD and she may get a call from them as well.

## 2016-01-20 NOTE — Telephone Encounter (Signed)
Message left on home number, [phone], to return call at earliest convenience.

## 2016-01-20 NOTE — Telephone Encounter (Signed)
Patient returned call

## 2016-01-20 NOTE — Telephone Encounter (Signed)
Pt states she did receive call from PCP, but they did not go over numbers or give her any specifics.  Values given to patient with comparison to last exam.  Encourage to avoid ladders, continue walking and add upper body weights.  She will continue current calcium and vit d supplement. She has no further questions at this time, but will discuss more with Melanie Circle, FNP at annual exam later this year.  She will call if she has any questions prior to that time.  Closing encounter.

## 2016-08-07 DIAGNOSIS — M7061 Trochanteric bursitis, right hip: Secondary | ICD-10-CM | POA: Insufficient documentation

## 2016-08-23 ENCOUNTER — Other Ambulatory Visit: Payer: Self-pay

## 2016-09-28 DIAGNOSIS — Z23 Encounter for immunization: Secondary | ICD-10-CM | POA: Diagnosis not present

## 2016-10-20 ENCOUNTER — Other Ambulatory Visit: Payer: Self-pay | Admitting: Nurse Practitioner

## 2016-10-20 NOTE — Telephone Encounter (Signed)
Medication refill request: Evista  Last AEX:  05/27/13? PG Next AEX: 12/12/16 Breast check only PG Last MMG (if hormonal medication request): 12/29/15 Right BIRADS1:neg  Refill authorized: 10/20/15 #90 tabs/ 4Refills. Today please advise.

## 2016-11-10 ENCOUNTER — Ambulatory Visit: Payer: Medicare Other | Admitting: Nurse Practitioner

## 2016-11-13 ENCOUNTER — Ambulatory Visit (INDEPENDENT_AMBULATORY_CARE_PROVIDER_SITE_OTHER): Payer: Medicare Other | Admitting: Nurse Practitioner

## 2016-11-13 ENCOUNTER — Encounter: Payer: Self-pay | Admitting: Nurse Practitioner

## 2016-11-13 VITALS — BP 130/84 | HR 68 | Ht 60.25 in | Wt 193.0 lb

## 2016-11-13 DIAGNOSIS — C50212 Malignant neoplasm of upper-inner quadrant of left female breast: Secondary | ICD-10-CM

## 2016-11-13 DIAGNOSIS — Z170 Estrogen receptor positive status [ER+]: Secondary | ICD-10-CM | POA: Diagnosis not present

## 2016-11-13 MED ORDER — RALOXIFENE HCL 60 MG PO TABS: 60.0000 mg | ORAL_TABLET | Freq: Every day | ORAL | 4 refills | Status: DC

## 2016-11-13 NOTE — Progress Notes (Signed)
Patient ID: Melanie Hamilton, female   DOB: 05-Jul-1946, 70 y.o.   MRN: [id number]  70 y.o. G2P0002 Married  Caucasian Fe here for annual breast exam. She has been diagnosed with breast cancer on the left 1989 with reconstruction.  She wears a prosthetic bra and does not need a new RX at this time.  She was treated with Tamoxifen and is now on Evista.  She prefers that we do her breast exams due to her PMH.  Her PCP does rest of exams.  She feels well in general and remains active doing all the yard work because of husbands health.  Son did come over this past weekend and help.  She is all for down sizing but husband does not want to do that.   Patient's last menstrual period was 12/26/1987.          Sexually active: No.  The current method of family planning is none.    Exercising: Yes.  Patient does yard work and is active around her home. Smoker:  no  Health Maintenance: Pap:  09/01/08, Negative  MMG: 12/29/15, Bi-Rads 1:  Negative Colonoscopy: 08/13/13, Normal, repeat in 5 years, Dr. Amedeo Plenty BMD: 12/29/15, T-Score: -1.90 Right Femur Neck  / -1.60 Left Femur Neck  / -1.10 Left Radius TDaP: 2010 Shingles: 2015 Prevnar 13: 2015 Hep C: will do at PCP Labs: PCP takes care of all labs   reports that she has never smoked. She has never used smokeless tobacco. She reports that she does not drink alcohol or use drugs.  Past Medical History:  Diagnosis Date  . Cancer [facility]) 1989   breast cancer/ chemo therapy/ mastectomy and reconstruction  . Chemotherapy adverse reaction    breast cancer-1989  . Diverticulosis 2007  . GERD (gastroesophageal reflux disease)   . Hiatal hernia   . Hyperplastic colon polyp 11/2001  . Osteoarthritis   . Shingles 5/20    Past Surgical History:  Procedure Laterality Date  . BREAST SURGERY  1989   reconstruction   . bunion removed  01/2005  . carpel tunnel release  1994  . COLONOSCOPY  2007  . KNEE SURGERY Right   . REPLACEMENT TOTAL KNEE Right 11/2008  . ROTATOR  CUFF REPAIR  08/2008    Current Outpatient Prescriptions  Medication Sig Dispense Refill  . aspirin 81 MG tablet Take 81 mg by mouth daily.    [name] atorvastatin (LIPITOR) 10 MG tablet Take 10 mg by mouth daily.    . calcium carbonate (OS-CAL) 600 MG TABS Take 600 mg by mouth daily.    . cholecalciferol (VITAMIN D) 1000 UNITS tablet Take 1,000 Units by mouth daily.    . fish oil-omega-3 fatty acids 1000 MG capsule Take 2 g by mouth daily.    [name] ibuprofen (ADVIL,MOTRIN) 100 MG tablet Take 100 mg by mouth every 6 (six) hours as needed for fever.    . meloxicam (MOBIC) 7.5 MG tablet Take 7.5 mg by mouth daily.    . naproxen sodium (ANAPROX) 220 MG tablet Take 220 mg by mouth 2 (two) times daily with a meal.    . omeprazole (PRILOSEC) 40 MG capsule Take 40 mg by mouth 2 (two) times daily.    . raloxifene (EVISTA) 60 MG tablet TAKE ONE TABLET BY MOUTH ONCE DAILY 90 tablet 0   No current facility-administered medications for this visit.     Family History  Problem Relation Age of Onset  . Cancer Brother     colon cancer  .  Osteoarthritis Mother   . Hypertension Mother   . Heart failure Father   . Cancer Maternal Grandfather   . Heart failure Paternal Grandmother     ROS:  Pertinent items are noted in HPI.  Otherwise, a comprehensive ROS was negative.  Exam:   LMP 12/26/1987    Ht Readings from Last 3 Encounters:  10/20/15 5' 0.5" (1.537 m)  07/08/14 5' 0.5" (1.537 m)  05/27/13 5\' 1"  (1.549 m)    General appearance: alert, cooperative and appears stated age Head: Normocephalic, without obvious abnormality, atraumatic Neck: no adenopathy, supple, symmetrical, trachea midline and thyroid normal to inspection and palpation Lungs: clear to auscultation bilaterally Breasts: normal appearance, no masses or tenderness, on the right.  On the left there is surgical repair and small implant.  There is no new mass. Heart: regular rate and rhythm Abdomen: soft, non-tender; no masses,  no  organomegaly Extremities: extremities normal, atraumatic, no cyanosis or edema Skin: Skin color, texture, turgor normal. No rashes or lesions Lymph nodes: Cervical, supraclavicular, and axillary nodes normal. No abnormal inguinal nodes palpated Neurologic: Grossly normal   Pelvic: exam is not done    A:        S/P left mastectomy secondary to breast cancer 1989 with + nodes, chemo and Tamoxifen, now Evista Postmenopausal Osteoarthritis Diverticulosis [place]: + colon cancer   P:   Reviewed health and wellness pertinent to exam  Mammogram is due 12/2016  Refill on Evista for a year  Counseled on breast self exam, mammography screening return annually or prn  An After Visit Summary was printed and given to the patient.

## 2016-11-13 NOTE — Patient Instructions (Signed)

## 2016-12-05 DIAGNOSIS — Z79899 Other long term (current) drug therapy: Secondary | ICD-10-CM | POA: Diagnosis not present

## 2016-12-05 DIAGNOSIS — Z1159 Encounter for screening for other viral diseases: Secondary | ICD-10-CM | POA: Diagnosis not present

## 2016-12-05 DIAGNOSIS — K219 Gastro-esophageal reflux disease without esophagitis: Secondary | ICD-10-CM | POA: Diagnosis not present

## 2016-12-05 DIAGNOSIS — Z6836 Body mass index (BMI) 36.0-36.9, adult: Secondary | ICD-10-CM | POA: Diagnosis not present

## 2016-12-05 DIAGNOSIS — E782 Mixed hyperlipidemia: Secondary | ICD-10-CM | POA: Diagnosis not present

## 2016-12-05 DIAGNOSIS — Z0001 Encounter for general adult medical examination with abnormal findings: Secondary | ICD-10-CM | POA: Diagnosis not present

## 2016-12-05 DIAGNOSIS — J301 Allergic rhinitis due to pollen: Secondary | ICD-10-CM | POA: Diagnosis not present

## 2016-12-05 DIAGNOSIS — I8390 Asymptomatic varicose veins of unspecified lower extremity: Secondary | ICD-10-CM | POA: Diagnosis not present

## 2016-12-05 DIAGNOSIS — E663 Overweight: Secondary | ICD-10-CM | POA: Diagnosis not present

## 2016-12-05 DIAGNOSIS — C50819 Malignant neoplasm of overlapping sites of unspecified female breast: Secondary | ICD-10-CM | POA: Diagnosis not present

## 2016-12-12 ENCOUNTER — Ambulatory Visit: Payer: Medicare Other | Admitting: Nurse Practitioner

## 2017-01-03 DIAGNOSIS — Z853 Personal history of malignant neoplasm of breast: Secondary | ICD-10-CM | POA: Diagnosis not present

## 2017-01-03 DIAGNOSIS — Z1231 Encounter for screening mammogram for malignant neoplasm of breast: Secondary | ICD-10-CM | POA: Diagnosis not present

## 2017-01-04 ENCOUNTER — Encounter: Payer: Self-pay | Admitting: Nurse Practitioner

## 2017-09-15 DIAGNOSIS — Z23 Encounter for immunization: Secondary | ICD-10-CM | POA: Diagnosis not present

## 2017-11-09 DIAGNOSIS — Z23 Encounter for immunization: Secondary | ICD-10-CM | POA: Diagnosis not present

## 2017-11-19 ENCOUNTER — Ambulatory Visit: Payer: Medicare Other | Admitting: Nurse Practitioner

## 2017-11-20 ENCOUNTER — Ambulatory Visit: Payer: Medicare Other | Admitting: Certified Nurse Midwife

## 2018-01-04 DIAGNOSIS — M8589 Other specified disorders of bone density and structure, multiple sites: Secondary | ICD-10-CM | POA: Diagnosis not present

## 2018-01-04 DIAGNOSIS — Z1231 Encounter for screening mammogram for malignant neoplasm of breast: Secondary | ICD-10-CM | POA: Diagnosis not present

## 2018-01-04 DIAGNOSIS — M810 Age-related osteoporosis without current pathological fracture: Secondary | ICD-10-CM | POA: Diagnosis not present

## 2018-01-04 DIAGNOSIS — Z853 Personal history of malignant neoplasm of breast: Secondary | ICD-10-CM | POA: Diagnosis not present

## 2018-01-10 ENCOUNTER — Ambulatory Visit: Payer: Medicare Other | Admitting: Certified Nurse Midwife

## 2018-01-14 ENCOUNTER — Encounter: Payer: Self-pay | Admitting: Certified Nurse Midwife

## 2018-01-29 ENCOUNTER — Other Ambulatory Visit: Payer: Self-pay

## 2018-01-29 NOTE — Telephone Encounter (Signed)
Medication refill request: Raloxifene Last OV:  11/13/16 Next AEX: 02/12/18 DL Last MMG (if hormonal medication request): 01/04/18 BIRADS 2 benign/density b Refill authorized: 11/13/16 #90 w/4 refills; today please advise

## 2018-01-30 DIAGNOSIS — E668 Other obesity: Secondary | ICD-10-CM | POA: Diagnosis not present

## 2018-01-30 DIAGNOSIS — N3000 Acute cystitis without hematuria: Secondary | ICD-10-CM | POA: Diagnosis not present

## 2018-01-30 DIAGNOSIS — M810 Age-related osteoporosis without current pathological fracture: Secondary | ICD-10-CM | POA: Diagnosis not present

## 2018-01-30 DIAGNOSIS — M545 Low back pain: Secondary | ICD-10-CM | POA: Diagnosis not present

## 2018-01-30 DIAGNOSIS — Z6837 Body mass index (BMI) 37.0-37.9, adult: Secondary | ICD-10-CM | POA: Diagnosis not present

## 2018-01-30 DIAGNOSIS — E782 Mixed hyperlipidemia: Secondary | ICD-10-CM | POA: Diagnosis not present

## 2018-01-30 DIAGNOSIS — K219 Gastro-esophageal reflux disease without esophagitis: Secondary | ICD-10-CM | POA: Diagnosis not present

## 2018-02-01 MED ORDER — RALOXIFENE HCL 60 MG PO TABS: 60.0000 mg | ORAL_TABLET | Freq: Every day | ORAL | 0 refills | Status: DC

## 2018-02-01 NOTE — Telephone Encounter (Signed)
I have reviewed her chart and will do one refill until exam

## 2018-02-12 ENCOUNTER — Other Ambulatory Visit (HOSPITAL_COMMUNITY)
Admission: RE | Admit: 2018-02-12 | Discharge: 2018-02-12 | Disposition: A | Discharge status: Home / Self Care | Payer: Medicare Other | Source: Ambulatory Visit | Attending: Obstetrics & Gynecology | Admitting: Obstetrics & Gynecology

## 2018-02-12 ENCOUNTER — Ambulatory Visit (INDEPENDENT_AMBULATORY_CARE_PROVIDER_SITE_OTHER): Payer: Medicare Other | Admitting: Certified Nurse Midwife

## 2018-02-12 ENCOUNTER — Other Ambulatory Visit: Payer: Self-pay

## 2018-02-12 ENCOUNTER — Encounter: Payer: Self-pay | Admitting: Certified Nurse Midwife

## 2018-02-12 VITALS — BP 120/76 | HR 72 | Resp 16 | Wt 192.4 lb

## 2018-02-12 DIAGNOSIS — Z01419 Encounter for gynecological examination (general) (routine) without abnormal findings: Secondary | ICD-10-CM

## 2018-02-12 DIAGNOSIS — N952 Postmenopausal atrophic vaginitis: Secondary | ICD-10-CM

## 2018-02-12 DIAGNOSIS — Z853 Personal history of malignant neoplasm of breast: Secondary | ICD-10-CM | POA: Diagnosis not present

## 2018-02-12 DIAGNOSIS — Z124 Encounter for screening for malignant neoplasm of cervix: Secondary | ICD-10-CM

## 2018-02-12 NOTE — Progress Notes (Signed)
72 y.o. G11P0002 Married  Caucasian Fe here for Breast annual exam. Last aex with pelvic exam per patient was in 2009 she thinks. She has been worried about vaginal dryness and urinary frequency at times. UTI was evaluated by her PCP and treated, no UTI noted per urine exam per patient. Patient would like pelvic exam also today and pap smear if indicated. She is aware that this important and would like to do pelvic exam every 2 years and breast exam only every year. Sees PCP for aex, labs, Vitamin D deficiency and cholesterol. Evista renewed recently also. Ambulates well with no issues and helps with grandchildren all the time. No other health issues today.  Patient's last menstrual period was 12/26/1987.          Sexually active: No.  The current method of family planning is post menopausal status.    Exercising: No.  The patient does not participate in regular exercise at present. Smoker:  no  Health Maintenance: Pap:  09-01-08 neg History of Abnormal Pap: no MMG:  01-04-18 rt breast category b density birads 2:neg Self Breast exams: occasionally  Colonoscopy:  2014 f/u 64yrs BMD:   2019 with PCP  TDaP:  2010 Shingles: 2015 Pneumonia: 2015 Hep C and HIV: PCP Labs: PCP   reports that  has never smoked. she has never used smokeless tobacco. She reports that she does not drink alcohol or use drugs.  Past Medical History:  Diagnosis Date  . Cancer [facility]) 1989   breast cancer/ chemo therapy/ mastectomy and reconstruction  . Chemotherapy adverse reaction    breast cancer-1989  . Diverticulosis 2007  . GERD (gastroesophageal reflux disease)   . Hiatal hernia   . Hyperplastic colon polyp 11/2001  . Osteoarthritis   . Shingles 5/20    Past Surgical History:  Procedure Laterality Date  . BREAST SURGERY  1989   reconstruction   . bunion removed  01/2005  . carpel tunnel release  1994  . COLONOSCOPY  2007  . KNEE SURGERY Right   . REPLACEMENT TOTAL KNEE Right 11/2008  . ROTATOR CUFF  REPAIR  08/2008    Current Outpatient Medications  Medication Sig Dispense Refill  . aspirin 81 MG tablet Take 81 mg by mouth daily.    [name] atorvastatin (LIPITOR) 10 MG tablet Take 10 mg by mouth daily.    . calcium carbonate (OS-CAL) 600 MG TABS Take 600 mg by mouth daily.    . cholecalciferol (VITAMIN D) 1000 UNITS tablet Take 1,000 Units by mouth daily.    . fish oil-omega-3 fatty acids 1000 MG capsule Take 1 g by mouth daily.     [name] ibuprofen (ADVIL,MOTRIN) 100 MG tablet Take 100 mg by mouth as needed for fever.     . meloxicam (MOBIC) 7.5 MG tablet Take 7.5 mg by mouth 2 (two) times daily.     . naproxen sodium (ANAPROX) 220 MG tablet Take 220 mg by mouth as needed.     [name] omeprazole (PRILOSEC) 40 MG capsule Take by mouth.    . raloxifene (EVISTA) 60 MG tablet Take 1 tablet (60 mg total) by mouth daily. 90 tablet 0  . ranitidine (ZANTAC) 300 MG tablet      No current facility-administered medications for this visit.     Family History  Problem Relation Age of Onset  . Cancer Brother        colon cancer  . Osteoarthritis Mother   . Hypertension Mother   . Heart failure Father   .  Cancer Maternal Grandfather   . Heart failure Paternal Grandmother     ROS:  Pertinent items are noted in HPI.  Otherwise, a comprehensive ROS was negative.  Exam:   BP 120/76 (BP Location: Right Arm, Patient Position: Sitting, Cuff Size: Large)   Pulse 72   Resp 16   Wt 192 lb 6.4 oz (87.3 kg)   LMP 12/26/1987   BMI 37.26 kg/m    Ht Readings from Last 3 Encounters:  11/13/16 5' 0.25" (1.53 m)  10/20/15 5' 0.5" (1.537 m)  07/08/14 5' 0.5" (1.537 m)    General appearance: alert, cooperative and appears stated age Head: Normocephalic, without obvious abnormality, atraumatic Neck: no adenopathy, supple, symmetrical, trachea midline and thyroid normal to inspection and palpation Lungs: clear to auscultation bilaterally Breasts: normal appearance, no masses or tenderness, No nipple retraction or  dimpling, No nipple discharge or bleeding, No axillary or supraclavicular adenopathy Heart: regular rate and rhythm Abdomen: soft, non-tender; no masses,  no organomegaly Extremities: extremities normal, atraumatic, no cyanosis or edema Skin: Skin color, texture, turgor normal. No rashes or lesions Lymph nodes: Cervical, supraclavicular, and axillary nodes normal. No abnormal inguinal nodes palpated Neurologic: Grossly normal   Pelvic: External genitalia:  no lesions, atrophic appearance              Urethra:  normal appearing urethra with no masses, tenderness or lesions              Bartholin's and Skene's: normal                 Vagina: atrophic  appearing vagina with normal color and scant discharge, no lesions, cystocele grade 2 noted              Cervix: multiparous appearance, no cervical motion tenderness, no lesions and friable with pap only              Pap taken: Yes.   Bimanual Exam:  Uterus:  normal size, contour, position, consistency, mobility, non-tender              Adnexa: normal adnexa and no mass, fullness, tenderness               Rectovaginal: Confirms               Anus:  normal sphincter tone, no lesions  Chaperone present: yes  A:  Well Woman with normal exam  Post menopausal with atrophic vaginitis  History of breast cancer with chemotherapy in 1989, on Evista presently  Cholesterol/Vitamin D deficiency/labs with MD management  P:   Reviewed health and wellness pertinent to exam  Discussed importance of notifying with vaginal bleeding. Discussed vaginal changes noted and etiology. Discussed treatment options of OTC. Patient would like to try coconut oil and see if this helps with the frequency at times. Will advise if problems or no change. Encouraged to stay with same cream based soap.  Discussed cystocele finding and may not be emptying bladder effectively with this also. Discussed standing after voiding and sitting again to make sure she is empty. Discussed  etiology of cystocele and pessary use if needed. Warning signs of UTI reviewed. Patient will advise if problems  Stressed importance of regular monthly breast exams, mammogram yearly and clinical exam at least yearly. Questions addressed  Continue follow up with PCP as indicated  Pap smear: yes   counseled on breast self exam, mammography screening, feminine hygiene, adequate intake of calcium and vitamin D, diet and exercise,  Kegel's exercises  return annually or prn  An After Visit Summary was printed and given to the patient.

## 2018-02-12 NOTE — Patient Instructions (Addendum)
EXERCISE AND DIET:  We recommended that you start or continue a regular exercise program for good health. Regular exercise means any activity that makes your heart beat faster and makes you sweat.  We recommend exercising at least 30 minutes per day at least 3 days a week, preferably 4 or 5.  We also recommend a diet low in fat and sugar.  Inactivity, poor dietary choices and obesity can cause diabetes, heart attack, stroke, and kidney damage, among others.    ALCOHOL AND SMOKING:  Women should limit their alcohol intake to no more than 7 drinks/beers/glasses of wine (combined, not each!) per week. Moderation of alcohol intake to this level decreases your risk of breast cancer and liver damage. And of course, no recreational drugs are part of a healthy lifestyle.  And absolutely no smoking or even second hand smoke. Most people know smoking can cause heart and lung diseases, but did you know it also contributes to weakening of your bones? Aging of your skin?  Yellowing of your teeth and nails?  CALCIUM AND VITAMIN D:  Adequate intake of calcium and Vitamin D are recommended.  The recommendations for exact amounts of these supplements seem to change often, but generally speaking 600 mg of calcium (either carbonate or citrate) and 800 units of Vitamin D per day seems prudent. Certain women may benefit from higher intake of Vitamin D.  If you are among these women, your doctor will have told you during your visit.    PAP SMEARS:  Pap smears, to check for cervical cancer or precancers,  have traditionally been done yearly, although recent scientific advances have shown that most women can have pap smears less often.  However, every woman still should have a physical exam from her gynecologist every year. It will include a breast check, inspection of the vulva and vagina to check for abnormal growths or skin changes, a visual exam of the cervix, and then an exam to evaluate the size and shape of the uterus and  ovaries.  And after 71 years of age, a rectal exam is indicated to check for rectal cancers. We will also provide age appropriate advice regarding health maintenance, like when you should have certain vaccines, screening for sexually transmitted diseases, bone density testing, colonoscopy, mammograms, etc.   MAMMOGRAMS:  All women over 40 years old should have a yearly mammogram. Many facilities now offer a "3D" mammogram, which may cost around $50 extra out of pocket. If possible,  we recommend you accept the option to have the 3D mammogram performed.  It both reduces the number of women who will be called back for extra views which then turn out to be normal, and it is better than the routine mammogram at detecting truly abnormal areas.    COLONOSCOPY:  Colonoscopy to screen for colon cancer is recommended for all women at age 50.  We know, you hate the idea of the prep.  We agree, BUT, having colon cancer and not knowing it is worse!!  Colon cancer so often starts as a polyp that can be seen and removed at colonscopy, which can quite literally save your life!  And if your first colonoscopy is normal and you have no family history of colon cancer, most women don't have to have it again for 10 years.  Once every ten years, you can do something that may end up saving your life, right?  We will be happy to help you get it scheduled when you are ready.    Be sure to check your insurance coverage so you understand how much it will cost.  It may be covered as a preventative service at no cost, but you should check your particular policy.      Atrophic Vaginitis Atrophic vaginitis is when the tissues that line the vagina become dry and thin. This is caused by a drop in estrogen. Estrogen helps:  To keep the vagina moist.  To make a clear fluid that helps: ? To lubricate the vagina for sex. ? To protect the vagina from infection.  If the lining of the vagina is dry and thin, it may:  Make sex painful. It  may also cause bleeding.  Cause a feeling of: ? Burning. ? Irritation. ? Itchiness.  Make an exam of your vagina painful. It may also cause bleeding.  Make you lose interest in sex.  Cause a burning feeling when you pee.  Make your vaginal fluid (discharge) brown or yellow.  For some women, there are no symptoms. This condition is most common in women who do not get their regular menstrual periods anymore (menopause). This often starts when a woman is 45-55 years old. Follow these instructions at home:  Take medicines only as told by your doctor. Do not use any herbal or alternative medicines unless your doctor says it is okay.  Use over-the-counter products for dryness only as told by your doctor. These include: ? Creams. ? Lubricants. ? Moisturizers.  Do not douche.  Do not use products that can make your vagina dry. These include: ? Scented feminine sprays. ? Scented tampons. ? Scented soaps.  If it hurts to have sex, tell your sexual partner. Contact a doctor if:  Your discharge looks different than normal.  Your vagina has an unusual smell.  You have new symptoms.  Your symptoms do not get better with treatment.  Your symptoms get worse. This information is not intended to replace advice given to you by your health care provider. Make sure you discuss any questions you have with your health care provider. Document Released: 05/29/2008 Document Revised: 05/18/2016 Document Reviewed: 12/02/2014 Elsevier Interactive Patient Education  2018 Elsevier Inc.  

## 2018-02-14 LAB — CYTOLOGY - PAP: Diagnosis: NEGATIVE

## 2018-02-18 DIAGNOSIS — H6121 Impacted cerumen, right ear: Secondary | ICD-10-CM | POA: Diagnosis not present

## 2018-02-18 DIAGNOSIS — J301 Allergic rhinitis due to pollen: Secondary | ICD-10-CM | POA: Diagnosis not present

## 2018-02-18 DIAGNOSIS — J208 Acute bronchitis due to other specified organisms: Secondary | ICD-10-CM | POA: Diagnosis not present

## 2018-09-24 DIAGNOSIS — M810 Age-related osteoporosis without current pathological fracture: Secondary | ICD-10-CM | POA: Diagnosis not present

## 2018-09-24 DIAGNOSIS — E782 Mixed hyperlipidemia: Secondary | ICD-10-CM | POA: Diagnosis not present

## 2018-09-24 DIAGNOSIS — Z0000 Encounter for general adult medical examination without abnormal findings: Secondary | ICD-10-CM | POA: Diagnosis not present

## 2018-09-24 DIAGNOSIS — Z1231 Encounter for screening mammogram for malignant neoplasm of breast: Secondary | ICD-10-CM | POA: Diagnosis not present

## 2018-09-24 DIAGNOSIS — Z6838 Body mass index (BMI) 38.0-38.9, adult: Secondary | ICD-10-CM | POA: Diagnosis not present

## 2018-09-24 DIAGNOSIS — R5383 Other fatigue: Secondary | ICD-10-CM | POA: Diagnosis not present

## 2018-09-24 DIAGNOSIS — Z1211 Encounter for screening for malignant neoplasm of colon: Secondary | ICD-10-CM | POA: Diagnosis not present

## 2018-09-24 DIAGNOSIS — K219 Gastro-esophageal reflux disease without esophagitis: Secondary | ICD-10-CM | POA: Diagnosis not present

## 2018-10-16 DIAGNOSIS — Z23 Encounter for immunization: Secondary | ICD-10-CM | POA: Diagnosis not present

## 2018-12-04 DIAGNOSIS — H2513 Age-related nuclear cataract, bilateral: Secondary | ICD-10-CM | POA: Diagnosis not present

## 2018-12-04 DIAGNOSIS — H524 Presbyopia: Secondary | ICD-10-CM | POA: Diagnosis not present

## 2019-01-07 ENCOUNTER — Encounter: Payer: Self-pay | Admitting: Certified Nurse Midwife

## 2019-01-07 DIAGNOSIS — Z1231 Encounter for screening mammogram for malignant neoplasm of breast: Secondary | ICD-10-CM | POA: Diagnosis not present

## 2019-01-07 DIAGNOSIS — Z853 Personal history of malignant neoplasm of breast: Secondary | ICD-10-CM | POA: Diagnosis not present

## 2019-02-05 ENCOUNTER — Telehealth: Payer: Self-pay | Admitting: Certified Nurse Midwife

## 2019-02-05 NOTE — Telephone Encounter (Signed)
Spoke with patient. Patient states she want Melanie Hamilton, CNM to know her bladder has "dropped a little since her last visit". Last AEX 02/12/18. Patient denies any pain, is more aware when she has been on her feet all day. Reports stress incontinence. Denies difficulty voiding or having BM. Denies vaginal d/c, bleeding, odor or lower back pain.   Recommended OV for further evaluation, patient declined. Patient states she briefly discussed pessary at last visit, will return call to schedule OV later, "schedule is too busy right now". Advised should new symptoms develop or symptoms worsen, return call to office for OV.   Routing to provider for final review. Patient is agreeable to disposition. Will close encounter.

## 2019-02-05 NOTE — Telephone Encounter (Signed)
Patient says her bladder has dropped.

## 2019-04-01 DIAGNOSIS — M810 Age-related osteoporosis without current pathological fracture: Secondary | ICD-10-CM | POA: Diagnosis not present

## 2019-04-01 DIAGNOSIS — E668 Other obesity: Secondary | ICD-10-CM | POA: Diagnosis not present

## 2019-04-01 DIAGNOSIS — K219 Gastro-esophageal reflux disease without esophagitis: Secondary | ICD-10-CM | POA: Diagnosis not present

## 2019-04-01 DIAGNOSIS — Z6837 Body mass index (BMI) 37.0-37.9, adult: Secondary | ICD-10-CM | POA: Diagnosis not present

## 2019-04-01 DIAGNOSIS — M545 Low back pain: Secondary | ICD-10-CM | POA: Diagnosis not present

## 2019-04-01 DIAGNOSIS — Z853 Personal history of malignant neoplasm of breast: Secondary | ICD-10-CM | POA: Diagnosis not present

## 2019-04-01 DIAGNOSIS — N3000 Acute cystitis without hematuria: Secondary | ICD-10-CM | POA: Diagnosis not present

## 2019-04-01 DIAGNOSIS — E782 Mixed hyperlipidemia: Secondary | ICD-10-CM | POA: Diagnosis not present

## 2019-04-25 DIAGNOSIS — B029 Zoster without complications: Secondary | ICD-10-CM

## 2019-04-25 HISTORY — DX: Zoster without complications: B02.9

## 2019-09-16 DIAGNOSIS — Z23 Encounter for immunization: Secondary | ICD-10-CM | POA: Diagnosis not present

## 2019-11-19 ENCOUNTER — Other Ambulatory Visit: Payer: Self-pay

## 2020-01-29 DIAGNOSIS — M8589 Other specified disorders of bone density and structure, multiple sites: Secondary | ICD-10-CM | POA: Diagnosis not present

## 2020-01-29 DIAGNOSIS — Z1231 Encounter for screening mammogram for malignant neoplasm of breast: Secondary | ICD-10-CM | POA: Diagnosis not present

## 2020-02-16 ENCOUNTER — Encounter: Payer: Self-pay | Admitting: Family Medicine

## 2020-02-18 ENCOUNTER — Ambulatory Visit: Payer: Medicare Other | Admitting: Certified Nurse Midwife

## 2020-03-01 ENCOUNTER — Encounter: Payer: Self-pay | Admitting: Family Medicine

## 2020-03-12 ENCOUNTER — Other Ambulatory Visit: Payer: Self-pay | Admitting: Family Medicine

## 2020-03-15 ENCOUNTER — Ambulatory Visit: Payer: Medicare Other | Admitting: Certified Nurse Midwife

## 2020-03-17 ENCOUNTER — Encounter: Payer: Self-pay | Admitting: Certified Nurse Midwife

## 2020-04-29 ENCOUNTER — Other Ambulatory Visit: Payer: Self-pay

## 2020-04-29 ENCOUNTER — Ambulatory Visit (INDEPENDENT_AMBULATORY_CARE_PROVIDER_SITE_OTHER): Payer: Medicare Other

## 2020-04-29 VITALS — BP 122/80 | HR 65 | Temp 97.9°F | Ht 58.5 in | Wt 185.0 lb

## 2020-04-29 DIAGNOSIS — R933 Abnormal findings on diagnostic imaging of other parts of digestive tract: Secondary | ICD-10-CM | POA: Diagnosis not present

## 2020-04-29 DIAGNOSIS — Z0000 Encounter for general adult medical examination without abnormal findings: Secondary | ICD-10-CM

## 2020-04-29 DIAGNOSIS — M810 Age-related osteoporosis without current pathological fracture: Secondary | ICD-10-CM | POA: Insufficient documentation

## 2020-04-29 NOTE — Progress Notes (Signed)
Subjective:   Melanie Hamilton is a 74 y.o. female who presents for Medicare Annual (Subsequent) preventive examination.  This wellness visit is conducted by a nurse.  The patient's medications were reviewed and reconciled since the patient's last visit.  History details were provided by the patient.  The history appears to be reliable.    Patient's last AWV was over a year ago.   Medical History: Patient history and Family history was reviewed  Medications, Allergies, and preventative health maintenance was reviewed and updated.   Review of Systems:  Review of Systems  Constitutional: Negative.   HENT: Positive for sinus pressure and sinus pain.   Eyes: Negative.  Negative for pain and visual disturbance.  Respiratory: Negative.  Negative for cough, chest tightness and shortness of breath.   Cardiovascular: Negative.  Negative for chest pain and palpitations.  Gastrointestinal: Negative.  Negative for blood in stool, constipation, diarrhea and nausea.  Endocrine: Negative.   Genitourinary: Negative.   Musculoskeletal: Positive for arthralgias. Negative for back pain, joint swelling and myalgias.  Allergic/Immunologic: Positive for environmental allergies.  Neurological: Positive for headaches. Negative for syncope and weakness.       Sinus headache after mowing  Psychiatric/Behavioral: Negative for confusion, sleep disturbance and suicidal ideas. The patient is not nervous/anxious.    Cardiac Risk Factors include: advanced age (>41men, >9 women);obesity (BMI >30kg/m2)     Objective:     Vitals: BP 122/80 (BP Location: Right Arm, Patient Position: Sitting, Cuff Size: Large)   Pulse 65   Temp 97.9 F (36.6 C) (Temporal)   Ht 4' 10.5" (1.486 m)   Wt 185 lb (83.9 kg)   LMP 12/26/1987   SpO2 97%   BMI 38.01 kg/m   Body mass index is 38.01 kg/m.  Advanced Directives 04/29/2020  Does Patient Have a Medical Advance Directive? Yes  Type of Paramedic of  [place];Living will  Does patient want to make changes to medical advance directive? No - Patient declined  Copy of [place] in Chart? No - copy requested    Tobacco Social History   Tobacco Use  Smoking Status Never Smoker  Smokeless Tobacco Never Used      Clinical Intake:  Pre-visit preparation completed: Yes  Pain : 0-10 Pain Score: 2  Pain Type: Acute pain Pain Location: Head Pain Descriptors / Indicators: Aching Pain Onset: Today Pain Frequency: Rarely Pain Relieving Factors: Tylenol Effect of Pain on Daily Activities: None  BMI - recorded: 38.01 Nutritional Status: BMI > 30  Obese Nutritional Risks: None Diabetes: No  How often do you need to have someone help you when you read instructions, pamphlets, or other written materials from your doctor or pharmacy?: 2 - Rarely  Interpreter Needed?: No     Past Medical History:  Diagnosis Date  . Cancer [facility]) 1989   breast cancer/ chemo therapy/ mastectomy and reconstruction  . Chemotherapy adverse reaction    breast cancer-1989  . Diverticulosis 2007  . GERD (gastroesophageal reflux disease)   . Hiatal hernia   . HX: breast cancer 1989  . Hyperplastic colon polyp 11/2001  . Osteoarthritis   . Shingles 5/20   Past Surgical History:  Procedure Laterality Date  . BREAST SURGERY  1989   reconstruction   . bunion removed  01/2005  . carpel tunnel release  1994  . COLONOSCOPY  2007  . KNEE SURGERY Right   . REPLACEMENT TOTAL KNEE Right 11/2008  . ROTATOR CUFF REPAIR  08/2008   Family History  Problem Relation Age of Onset  . Cancer Brother        colon cancer  . Osteoarthritis Mother   . Hypertension Mother   . Heart failure Father   . Cancer Maternal Grandfather   . Heart failure Paternal Grandmother    Social History   Socioeconomic History  . Marital status: Widowed    Spouse name: Not on file  . Number of children: 2  . Years of education: Not on file  . Highest  education level: Not on file  Occupational History  . Not on file  Tobacco Use  . Smoking status: Never Smoker  . Smokeless tobacco: Never Used  Substance and Sexual Activity  . Alcohol use: No  . Drug use: No  . Sexual activity: Never    Birth control/protection: Post-menopausal  Other Topics Concern  . Not on file  Social History Narrative  . Not on file    Outpatient Encounter Medications as of 04/29/2020  Medication Sig  . aspirin 81 MG tablet Take 81 mg by mouth daily.  [name] atorvastatin (LIPITOR) 20 MG tablet TAKE 1 TABLET BY MOUTH AT NIGHT  . calcium carbonate (OS-CAL) 600 MG TABS Take 600 mg by mouth daily.  . cholecalciferol (VITAMIN D) 1000 UNITS tablet Take 1,000 Units by mouth daily.  . fish oil-omega-3 fatty acids 1000 MG capsule Take 1 g by mouth daily.   [name] ibuprofen (ADVIL,MOTRIN) 100 MG tablet Take 100 mg by mouth as needed for fever.   . meloxicam (MOBIC) 7.5 MG tablet Take 7.5 mg by mouth 2 (two) times daily.   . Multiple Vitamin (MULTIVITAMIN) capsule Take 1 capsule by mouth daily.  . naproxen sodium (ANAPROX) 220 MG tablet Take 220 mg by mouth as needed.   [name] omeprazole (PRILOSEC) 40 MG capsule Take by mouth.  . raloxifene (EVISTA) 60 MG tablet Take 1 tablet (60 mg total) by mouth daily.  . [DISCONTINUED] atorvastatin (LIPITOR) 10 MG tablet Take 10 mg by mouth daily.  . [DISCONTINUED] ranitidine (ZANTAC) 300 MG tablet    No facility-administered encounter medications on file as of 04/29/2020.    Activities of Daily Living In your present state of health, do you have any difficulty performing the following activities: 04/29/2020  Hearing? N  Vision? N  Difficulty concentrating or making decisions? N  Walking or climbing stairs? N  Dressing or bathing? N  Doing errands, shopping? N  Preparing Food and eating ? N  Using the Toilet? N  In the past six months, have you accidently leaked urine? N  Do you have problems with loss of bowel control? N  Managing your  Medications? N  Managing your Finances? N  Housekeeping or managing your Housekeeping? N  Some recent data might be hidden    Patient Care Team: Rochel Brome, MD as PCP - General (Family Medicine) Teena Irani, MD (Inactive) (Gastroenterology)    Assessment:   This is a routine wellness examination for [place].  Exercise Activities and Dietary recommendations Current Exercise Habits: Home exercise routine, Type of exercise: walking, Intensity: Mild  Goals    . Advanced Care Planning complete by next visit     Patient has Advance Directive, she will bring in to office to put on file    . Increase physical activity     Walking daily    . Prevent falls       Fall Risk Fall Risk  04/29/2020 11/19/2019 08/23/2016  Falls in the  past year? 0 0 No  Comment - Emmi Telephone Survey: data to providers prior to load Emmi Telephone Survey: data to providers prior to load  Number falls in past yr: 0 - -  Injury with Fall? 0 - -  Risk for fall due to : No Fall Risks - -  Follow up Falls evaluation completed;Falls prevention discussed - -   Is the patient's home free of loose throw rugs in walkways, pet beds, electrical cords, etc?   yes      Grab bars in the bathroom? no      Handrails on the stairs?   yes      Adequate lighting?   yes   Depression Screen PHQ 2/9 Scores 04/29/2020  PHQ - 2 Score 0     Cognitive Function     6CIT Screen 04/29/2020  What Year? 0 points  What month? 0 points  What time? 0 points  Count back from 20 0 points  Months in reverse 0 points  Repeat phrase 0 points  Total Score 0    Immunization History  Administered Date(s) Administered  . DTaP 12/25/2008  . Influenza, High Dose Seasonal PF 10/16/2018  . Influenza-Unspecified 12/25/2018  . Pneumococcal-Unspecified 12/25/2013   Patient received both [place], she will let me know the date  Screening Tests Health Maintenance  Topic Date Due  . Hepatitis C Screening  Never done  .  TETANUS/TDAP  Never done  . PNA vac Low Risk Adult (2 of 2 - PCV13) 12/25/2014  . COLONOSCOPY  08/13/2018  . INFLUENZA VACCINE  07/25/2020  . MAMMOGRAM  01/28/2022  . DEXA SCAN  Completed    Cancer Screenings: Lung: Low Dose CT Chest recommended if Age 63-80 years, 30 pack-year currently smoking OR have quit w/in 15years. Patient does not qualify. Breast:  Up to date on Mammogram? Yes   Up to date of Bone Density/Dexa? Yes Colorectal: Due - Referral [facility]      Plan:    Counseling was provided today regarding the following topics: healthy eating habits, home safety, vitamin and mineral supplementation (calcium and Vit D), regular exercise, breast self-exam, tobacco avoidance, limitation of alcohol intake, use of seat belts, firearm safety, and fall prevention.  Annual recommendations include: influenza vaccine, dental cleanings, and eye exams.  I have personally reviewed and noted the following in the patient's chart:   . Medical and social history . Use of alcohol, tobacco or illicit drugs  . Current medications and supplements . Functional ability and status . Nutritional status . Physical activity . Advanced directives . List of other physicians . Hospitalizations, surgeries, and ER visits in previous 12 months . Vitals . Screenings to include cognitive, depression, and falls . Referrals and appointments  In addition, I have reviewed and discussed with patient certain preventive protocols, quality metrics, and best practice recommendations. A written personalized care plan for preventive services as well as general preventive health recommendations were provided to patient.     Erie Noe, LPN  [id number]

## 2020-04-29 NOTE — Patient Instructions (Signed)
Colonoscopy, Adult A colonoscopy is a procedure to look at the entire large intestine. This procedure is done using a long, thin, flexible tube that has a camera on the end. You may have a colonoscopy:  As a part of normal colorectal screening.  If you have certain symptoms, such as: ? A low number of red blood cells in your blood (anemia). ? Diarrhea that does not go away. ? Pain in your abdomen. ? Blood in your stool. A colonoscopy can help screen for and diagnose medical problems, including:  Tumors.  Extra tissue that grows where mucus forms (polyps).  Inflammation.  Areas of bleeding. Tell your health care provider about:  Any allergies you have.  All medicines you are taking, including vitamins, herbs, eye drops, creams, and over-the-counter medicines.  Any problems you or family members have had with anesthetic medicines.  Any blood disorders you have.  Any surgeries you have had.  Any medical conditions you have.  Any problems you have had with having bowel movements.  Whether you are pregnant or may be pregnant. What are the risks? Generally, this is a safe procedure. However, problems may occur, including:  Bleeding.  Damage to your intestine.  Allergic reactions to medicines given during the procedure.  Infection. This is rare. What happens before the procedure? Eating and drinking restrictions Follow instructions from your health care provider about eating or drinking restrictions, which may include:  A few days before the procedure: ? Follow a low-fiber diet. ? Avoid nuts, seeds, dried fruit, raw fruits, and vegetables.  1-3 days before the procedure: ? Eat only gelatin dessert or ice pops. ? Drink only clear liquids, such as water, clear juice, clear broth or bouillon, black coffee or tea, or clear soft drinks or sports drinks. ? Avoid liquids that contain red or purple dye.  The day of the procedure: ? Do not eat solid foods. You may  continue to drink clear liquids until up to 2 hours before the procedure. ? Do not eat or drink anything starting 2 hours before the procedure, or within the time period that your health care provider recommends. Bowel prep If you were prescribed a bowel prep to take by mouth (orally) to clean out your colon:  Take it as told by your health care provider. Starting the day before your procedure, you will need to drink a large amount of liquid medicine. The liquid will cause you to have many bowel movements of loose stool until your stool becomes almost clear or light green.  If your skin or the opening between the buttocks (anus) gets irritated from diarrhea, you may relieve the irritation using: ? Wipes with medicine in them, such as adult wet wipes with aloe and vitamin E. ? A product to soothe skin, such as petroleum jelly.  If you vomit while drinking the bowel prep: ? Take a break for up to 60 minutes. ? Begin the bowel prep again. ? Call your health care provider if you keep vomiting or you cannot take the bowel prep without vomiting.  To clean out your colon, you may also be given: ? Laxative medicines. These help you have a bowel movement. ? Instructions for enema use. An enema is liquid medicine injected into your rectum. Medicines Ask your health care provider about:  Changing or stopping your regular medicines or supplements. This is especially important if you are taking iron supplements, diabetes medicines, or blood thinners.  Taking medicines such as aspirin and ibuprofen. These  medicines can thin your blood. Do not take these medicines unless your health care provider tells you to take them.  Taking over-the-counter medicines, vitamins, herbs, and supplements. General instructions  Ask your health care provider what steps will be taken to help prevent infection. These may include washing skin with a germ-killing soap.  Plan to have someone take you home from the hospital  or clinic. What happens during the procedure?   An IV will be inserted into one of your veins.  You may be given one or more of the following: ? A medicine to help you relax (sedative). ? A medicine to numb the area (local anesthetic). ? A medicine to make you fall asleep (general anesthetic). This is rarely needed.  You will lie on your side with your knees bent.  The tube will: ? Have oil or gel put on it (be lubricated). ? Be inserted into your anus. ? Be gently eased through all parts of your large intestine.  Air will be sent into your colon to keep it open. This may cause some pressure or cramping.  Images will be taken with the camera and will appear on a screen.  A small tissue sample may be removed to be looked at under a microscope (biopsy). The tissue may be sent to a lab for testing if any signs of problems are found.  If small polyps are found, they may be removed and checked for cancer cells.  When the procedure is finished, the tube will be removed. The procedure may vary among health care providers and hospitals. What happens after the procedure?  Your blood pressure, heart rate, breathing rate, and blood oxygen level will be monitored until you leave the hospital or clinic.  You may have a small amount of blood in your stool.  You may pass gas and have mild cramping or bloating in your abdomen. This is caused by the air that was used to open your colon during the exam.  Do not drive for 24 hours after the procedure.  It is up to you to get the results of your procedure. Ask your health care provider, or the department that is doing the procedure, when your results will be ready. Summary  A colonoscopy is a procedure to look at the entire large intestine.  Follow instructions from your health care provider about eating and drinking before the procedure.  If you were prescribed an oral bowel prep to clean out your colon, take it as told by your health care  provider.  During the colonoscopy, a flexible tube with a camera on its end is inserted into the anus and then passed into the other parts of the large intestine. This information is not intended to replace advice given to you by your health care provider. Make sure you discuss any questions you have with your health care provider. Document Revised: 07/04/2019 Document Reviewed: 07/04/2019 Elsevier Patient Education  [place] Prevention in the Home, Adult Falls can cause injuries. They can happen to people of all ages. There are many things you can do to make your home safe and to help prevent falls. Ask for help when making these changes, if needed. What actions can I take to prevent falls? General Instructions  Use good lighting in all rooms. Replace any light bulbs that burn out.  Turn on the lights when you go into a dark area. Use night-lights.  Keep items that you use often in easy-to-reach places.  Lower the shelves around your home if necessary.  Set up your furniture so you have a clear path. Avoid moving your furniture around.  Do not have throw rugs and other things on the floor that can make you trip.  Avoid walking on wet floors.  If any of your floors are uneven, fix them.  Add color or contrast paint or tape to clearly mark and help you see: ? Any grab bars or handrails. ? First and last steps of stairways. ? Where the edge of each step is.  If you use a stepladder: ? Make sure that it is fully opened. Do not climb a closed stepladder. ? Make sure that both sides of the stepladder are locked into place. ? Ask someone to hold the stepladder for you while you use it.  If there are any pets around you, be aware of where they are. What can I do in the bathroom?      Keep the floor dry. Clean up any water that spills onto the floor as soon as it happens.  Remove soap buildup in the tub or shower regularly.  Use non-skid mats or decals on the  floor of the tub or shower.  Attach bath mats securely with double-sided, non-slip rug tape.  If you need to sit down in the shower, use a plastic, non-slip stool.  Install grab bars by the toilet and in the tub and shower. Do not use towel bars as grab bars. What can I do in the bedroom?  Make sure that you have a light by your bed that is easy to reach.  Do not use any sheets or blankets that are too big for your bed. They should not hang down onto the floor.  Have a firm chair that has side arms. You can use this for support while you get dressed. What can I do in the kitchen?  Clean up any spills right away.  If you need to reach something above you, use a strong step stool that has a grab bar.  Keep electrical cords out of the way.  Do not use floor polish or wax that makes floors slippery. If you must use wax, use non-skid floor wax. What can I do with my stairs?  Do not leave any items on the stairs.  Make sure that you have a light switch at the top of the stairs and the bottom of the stairs. If you do not have them, ask someone to add them for you.  Make sure that there are handrails on both sides of the stairs, and use them. Fix handrails that are broken or loose. Make sure that handrails are as long as the stairways.  Install non-slip stair treads on all stairs in your home.  Avoid having throw rugs at the top or bottom of the stairs. If you do have throw rugs, attach them to the floor with carpet tape.  Choose a carpet that does not hide the edge of the steps on the stairway.  Check any carpeting to make sure that it is firmly attached to the stairs. Fix any carpet that is loose or worn. What can I do on the outside of my home?  Use bright outdoor lighting.  Regularly fix the edges of walkways and driveways and fix any cracks.  Remove anything that might make you trip as you walk through a door, such as a raised step or threshold.  Trim any bushes or trees on  the path  to your home.  Regularly check to see if handrails are loose or broken. Make sure that both sides of any steps have handrails.  Install guardrails along the edges of any raised decks and porches.  Clear walking paths of anything that might make someone trip, such as tools or rocks.  Have any leaves, snow, or ice cleared regularly.  Use sand or salt on walking paths during winter.  Clean up any spills in your garage right away. This includes grease or oil spills. What other actions can I take?  Wear shoes that: ? Have a low heel. Do not wear high heels. ? Have rubber bottoms. ? Are comfortable and fit you well. ? Are closed at the toe. Do not wear open-toe sandals.  Use tools that help you move around (mobility aids) if they are needed. These include: ? Canes. ? Walkers. ? Scooters. ? Crutches.  Review your medicines with your doctor. Some medicines can make you feel dizzy. This can increase your chance of falling. Ask your doctor what other things you can do to help prevent falls. Where to find more information  Centers for Disease Control and Prevention, [facility]: https://garcia.biz/  [employer] on Aging: BrainJudge.co.uk Contact a doctor if:  You are afraid of falling at home.  You feel weak, drowsy, or dizzy at home.  You fall at home. Summary  There are many simple things that you can do to make your home safe and to help prevent falls.  Ways to make your home safe include removing tripping hazards and installing grab bars in the bathroom.  Ask for help when making these changes in your home. This information is not intended to replace advice given to you by your health care provider. Make sure you discuss any questions you have with your health care provider. Document Revised: 04/03/2019 Document Reviewed: 07/26/2017 Elsevier Patient Education  2020 [place] Maintenance, Female Adopting a healthy lifestyle and getting  preventive care are important in promoting health and wellness. Ask your health care provider about:  The right schedule for you to have regular tests and exams.  Things you can do on your own to prevent diseases and keep yourself healthy. What should I know about diet, weight, and exercise? Eat a healthy diet   Eat a diet that includes plenty of vegetables, fruits, low-fat dairy products, and lean protein.  Do not eat a lot of foods that are high in solid fats, added sugars, or sodium. Maintain a healthy weight Body mass index (BMI) is used to identify weight problems. It estimates body fat based on height and weight. Your health care provider can help determine your BMI and help you achieve or maintain a healthy weight. Get regular exercise Get regular exercise. This is one of the most important things you can do for your health. Most adults should:  Exercise for at least 150 minutes each week. The exercise should increase your heart rate and make you sweat (moderate-intensity exercise).  Do strengthening exercises at least twice a week. This is in addition to the moderate-intensity exercise.  Spend less time sitting. Even light physical activity can be beneficial. Watch cholesterol and blood lipids Have your blood tested for lipids and cholesterol at 72 years of age, then have this test every 5 years. Have your cholesterol levels checked more often if:  Your lipid or cholesterol levels are high.  You are older than 73 years of age.  You are at high risk for heart  disease. What should I know about cancer screening? Depending on your health history and family history, you may need to have cancer screening at various ages. This may include screening for:  Breast cancer.  Cervical cancer.  Colorectal cancer.  Skin cancer.  Lung cancer. What should I know about heart disease, diabetes, and high blood pressure? Blood pressure and heart disease  High blood pressure causes  heart disease and increases the risk of stroke. This is more likely to develop in people who have high blood pressure readings, are of African descent, or are overweight.  Have your blood pressure checked: ? Every 3-5 years if you are 27-43 years of age. ? Every year if you are 25 years old or older. Diabetes Have regular diabetes screenings. This checks your fasting blood sugar level. Have the screening done:  Once every three years after age 2 if you are at a normal weight and have a low risk for diabetes.  More often and at a younger age if you are overweight or have a high risk for diabetes. What should I know about preventing infection? Hepatitis B If you have a higher risk for hepatitis B, you should be screened for this virus. Talk with your health care provider to find out if you are at risk for hepatitis B infection. Hepatitis C Testing is recommended for:  Everyone born from [birth year] through 1964.  Anyone with known risk factors for hepatitis C. Sexually transmitted infections (STIs)  Get screened for STIs, including gonorrhea and chlamydia, if: ? You are sexually active and are younger than 73 years of age. ? You are older than 73 years of age and your health care provider tells you that you are at risk for this type of infection. ? Your sexual activity has changed since you were last screened, and you are at increased risk for chlamydia or gonorrhea. Ask your health care provider if you are at risk.  Ask your health care provider about whether you are at high risk for HIV. Your health care provider may recommend a prescription medicine to help prevent HIV infection. If you choose to take medicine to prevent HIV, you should first get tested for HIV. You should then be tested every 3 months for as long as you are taking the medicine. Pregnancy  If you are about to stop having your period (premenopausal) and you may become pregnant, seek counseling before you get pregnant.  Take  400 to 800 micrograms (mcg) of folic acid every day if you become pregnant.  Ask for birth control (contraception) if you want to prevent pregnancy. Osteoporosis and menopause Osteoporosis is a disease in which the bones lose minerals and strength with aging. This can result in bone fractures. If you are 11 years old or older, or if you are at risk for osteoporosis and fractures, ask your health care provider if you should:  Be screened for bone loss.  Take a calcium or vitamin D supplement to lower your risk of fractures.  Be given hormone replacement therapy (HRT) to treat symptoms of menopause. Follow these instructions at home: Lifestyle  Do not use any products that contain nicotine or tobacco, such as cigarettes, e-cigarettes, and chewing tobacco. If you need help quitting, ask your health care provider.  Do not use street drugs.  Do not share needles.  Ask your health care provider for help if you need support or information about quitting drugs. Alcohol use  Do not drink alcohol if: ? Your health  care provider tells you not to drink. ? You are pregnant, may be pregnant, or are planning to become pregnant.  If you drink alcohol: ? Limit how much you use to 0-1 drink a day. ? Limit intake if you are breastfeeding.  Be aware of how much alcohol is in your drink. In the [place] one drink equals one 12 oz bottle of beer (355 mL), one 5 oz glass of wine (148 mL), or one 1 oz glass of hard liquor (44 mL). General instructions  Schedule regular health, dental, and eye exams.  Stay current with your vaccines.  Tell your health care provider if: ? You often feel depressed. ? You have ever been abused or do not feel safe at home. Summary  Adopting a healthy lifestyle and getting preventive care are important in promoting health and wellness.  Follow your health care provider's instructions about healthy diet, exercising, and getting tested or screened for diseases.  Follow  your health care provider's instructions on monitoring your cholesterol and blood pressure. This information is not intended to replace advice given to you by your health care provider. Make sure you discuss any questions you have with your health care provider. Document Revised: 12/04/2018 Document Reviewed: 12/04/2018 Elsevier Patient Education  2020 [employer].

## 2020-05-22 ENCOUNTER — Other Ambulatory Visit: Payer: Self-pay | Admitting: Family Medicine

## 2020-06-09 ENCOUNTER — Ambulatory Visit (INDEPENDENT_AMBULATORY_CARE_PROVIDER_SITE_OTHER): Payer: Medicare Other | Admitting: Family Medicine

## 2020-06-09 ENCOUNTER — Encounter: Payer: Self-pay | Admitting: Family Medicine

## 2020-06-09 ENCOUNTER — Other Ambulatory Visit: Payer: Self-pay

## 2020-06-09 VITALS — BP 138/80 | HR 75 | Temp 97.4°F | Ht 61.0 in | Wt 184.0 lb

## 2020-06-09 DIAGNOSIS — M79671 Pain in right foot: Secondary | ICD-10-CM | POA: Diagnosis not present

## 2020-06-09 DIAGNOSIS — E782 Mixed hyperlipidemia: Secondary | ICD-10-CM | POA: Diagnosis not present

## 2020-06-09 DIAGNOSIS — K219 Gastro-esophageal reflux disease without esophagitis: Secondary | ICD-10-CM | POA: Diagnosis not present

## 2020-06-09 DIAGNOSIS — M810 Age-related osteoporosis without current pathological fracture: Secondary | ICD-10-CM | POA: Diagnosis not present

## 2020-06-09 NOTE — Progress Notes (Signed)
Subjective:  Patient ID: Melanie Hamilton, female    DOB: 12/14/1946  Age: 74 y.o. MRN: [id number]  Chief Complaint  Patient presents with  . Hyperlipidemia  . Gastroesophageal Reflux    HPI  Pt presents with hyperlipidemia.  Current treatment includes Lipitor and fish oil.  Compliance with treatment has been good; she takes her medication as directed, maintains her low cholesterol diet, and follows up as directed.  Exercising.    Dx with age-related osteoporosis without current pathological fracture; this is an established diagnosis of osteoporosis.  Tests done to help confirm the diagnosis and evaluate for treatable causes include bone densinometry test ( decreased bone mass ).  The patients' current bone density was done in 12/2017.  The patient's current bone related medication includes Raloxifene (Evista) 60mg /day, Calcium and Vitamin D.      Melanie Hamilton presents with a diagnosis of gastro-esophageal reflux disease without esophagitis.  The course has been stable and nonprogressive.  Taking omeprazole 40 mg once daily.  She has been on this for years.      Melanie Hamilton presents with a diagnosis of personal history of malignant neoplasm of breast.  Gynecologist checks her annually.The course has been resolved..  Treated successfully with surgery.    Current Outpatient Medications on File Prior to Visit  Medication Sig Dispense Refill  . aspirin 81 MG tablet Take 81 mg by mouth daily.    Melanie Hamilton atorvastatin (LIPITOR) 20 MG tablet TAKE 1 TABLET BY MOUTH AT NIGHT 90 tablet 0  . calcium carbonate (OS-CAL) 600 MG TABS Take 600 mg by mouth in the morning and at bedtime.     . cholecalciferol (VITAMIN D) 1000 UNITS tablet Take 1,000 Units by mouth daily.    . fish oil-omega-3 fatty acids 1000 MG capsule Take 1 g by mouth daily.     Melanie Hamilton ibuprofen (ADVIL,MOTRIN) 100 MG tablet Take 100 mg by mouth as needed for fever.     . meloxicam (MOBIC) 7.5 MG tablet Take 7.5 mg by mouth 2 (two) times daily.     . Multiple  Vitamin (MULTIVITAMIN) capsule Take 1 capsule by mouth daily.    . naproxen sodium (ANAPROX) 220 MG tablet Take 220 mg by mouth as needed.     Melanie Hamilton omeprazole (PRILOSEC) 40 MG capsule Take by mouth.    . raloxifene (EVISTA) 60 MG tablet Take 1 tablet by mouth once daily 90 tablet 0   No current facility-administered medications on file prior to visit.   Past Medical History:  Diagnosis Date  . Cancer [facility]) 1989   breast cancer/ chemo therapy/ mastectomy and reconstruction  . Chemotherapy adverse reaction    breast cancer-1989  . Diverticulosis 2007  . GERD (gastroesophageal reflux disease)   . Hiatal hernia   . HX: breast cancer 1989  . Hyperplastic colon polyp 11/2001  . Mixed hyperlipidemia   . Osteoarthritis   . Shingles 5/20   Past Surgical History:  Procedure Laterality Date  . BREAST SURGERY  1989   reconstruction   . bunion removed  01/2005  . carpel tunnel release  1994  . COLONOSCOPY  2007  . KNEE SURGERY Right   . REPLACEMENT TOTAL KNEE Right 11/2008  . ROTATOR CUFF REPAIR  08/2008    Family History  Problem Relation Age of Onset  . Cancer Brother        colon cancer  . Osteoarthritis Mother   . Hypertension Mother   . Heart failure Father   . Cancer Maternal  Grandfather   . Heart failure Paternal Grandmother    Social History   Socioeconomic History  . Marital status: Widowed    Spouse name: Not on file  . Number of children: 2  . Years of education: Not on file  . Highest education level: Not on file  Occupational History  . Not on file  Tobacco Use  . Smoking status: Never Smoker  . Smokeless tobacco: Never Used  Substance and Sexual Activity  . Alcohol use: No  . Drug use: No  . Sexual activity: Never    Birth control/protection: Post-menopausal  Other Topics Concern  . Not on file  Social History Narrative  . Not on file   Social Determinants of Health   Financial Resource Strain:   . Difficulty of Paying Living Expenses:   Food  Insecurity:   . Worried About Charity fundraiser in the Last Year:   . Arboriculturist in the Last Year:   Transportation Needs:   . Film/video editor (Medical):   Melanie Hamilton Lack of Transportation (Non-Medical):   Physical Activity:   . Days of Exercise per Week:   . Minutes of Exercise per Session:   Stress:   . Feeling of Stress :   Social Connections:   . Frequency of Communication with Friends and Family:   . Frequency of Social Gatherings with Friends and Family:   . Attends Religious Services:   . Active Member of Clubs or Organizations:   . Attends Archivist Meetings:   Melanie Hamilton Marital Status:     Review of Systems  Constitutional: Negative for chills, fatigue and fever.  HENT: Negative for congestion, ear pain, rhinorrhea and sore throat.   Respiratory: Negative for cough and shortness of breath.   Cardiovascular: Negative for chest pain.  Gastrointestinal: Negative for abdominal pain, constipation, diarrhea, nausea and vomiting.  Genitourinary: Negative for dysuria and urgency.  Musculoskeletal: Negative for back pain and myalgias.       Rt foot pain. Bunion on rt foot, bunionectomy on left foot  Neurological: Positive for headaches. Negative for dizziness, weakness and light-headedness.  Psychiatric/Behavioral: Negative for dysphoric mood. The patient is not nervous/anxious.      Objective:  BP 138/80   Pulse 75   Temp (!) 97.4 F (36.3 C)   Ht 5\' 1"  (1.549 m)   Wt 184 lb (83.5 kg)   LMP 12/26/1987   SpO2 99%   BMI 34.77 kg/m   BP/Weight 06/09/2020 04/29/2020 9/76/7341  Systolic BP 937 902 409  Diastolic BP 80 80 76  Wt. (Lbs) 184 185 192.4  BMI 34.77 38.01 37.26    Physical Exam Vitals reviewed.  Constitutional:      Appearance: Normal appearance. She is normal weight.  Neck:     Vascular: No carotid bruit.  Cardiovascular:     Rate and Rhythm: Normal rate and regular rhythm.     Pulses: Normal pulses.     Heart sounds: Normal heart sounds.    Pulmonary:     Effort: Pulmonary effort is normal. No respiratory distress.     Breath sounds: Normal breath sounds.  Abdominal:     General: Abdomen is flat. Bowel sounds are normal.     Palpations: Abdomen is soft.     Tenderness: There is no abdominal tenderness.  Musculoskeletal:        General: Swelling (lat rt ankle) and tenderness (rt lat ankle) present.     Comments: Gait - ambulates with  right ankle inverting outward.  Neurological:     Mental Status: She is alert and oriented to person, place, and time.  Psychiatric:        Mood and Affect: Mood normal.        Behavior: Behavior normal.     Diabetic Foot Exam - Simple   No data filed       Lab Results  Component Value Date   WBC 3.8 06/09/2020   HGB 13.8 06/09/2020   HCT 39.0 06/09/2020   PLT 212 06/09/2020   GLUCOSE 87 06/09/2020   CHOL 165 06/09/2020   TRIG 65 06/09/2020   HDL 74 06/09/2020   LDLCALC 78 06/09/2020   ALT 18 06/09/2020   AST 25 06/09/2020   NA 141 06/09/2020   K 4.5 06/09/2020   CL 103 06/09/2020   CREATININE 0.86 06/09/2020   BUN 19 06/09/2020   CO2 25 06/09/2020   INR 1.0 12/17/2008      Assessment & Plan:   1. Mixed hyperlipidemia Well controlled.  No changes to medicines.  Continue to work on eating a healthy diet and exercise.  Labs drawn today.  - CBC with Differential/Platelet - Comprehensive metabolic panel - Lipid panel  2. Right foot pain - AMB referral to orthopedics  3. Age-related osteoporosis without current pathological fracture The current medical regimen is effective;  continue present plan and medications.  4. GERD without esophagitis  The current medical regimen is effective;  continue present plan and medications.    Orders Placed This Encounter  Procedures  . CBC with Differential/Platelet  . Comprehensive metabolic panel  . Lipid panel  . Cardiovascular Risk Assessment  . AMB referral to orthopedics     Follow-up: Return in about 1 year  (around 06/09/2021) for fastin.  An After Visit Summary was printed and given to the patient.  Rochel Brome [facility] [phone]

## 2020-06-10 LAB — CBC WITH DIFFERENTIAL/PLATELET
Basophils Absolute: 0 10*3/uL (ref 0.0–0.2)
Basos: 1 %
EOS (ABSOLUTE): 0.1 10*3/uL (ref 0.0–0.4)
Eos: 4 %
Hematocrit: 39 % (ref 34.0–46.6)
Hemoglobin: 13.8 g/dL (ref 11.1–15.9)
Immature Grans (Abs): 0 10*3/uL (ref 0.0–0.1)
Immature Granulocytes: 0 %
Lymphocytes Absolute: 1.4 10*3/uL (ref 0.7–3.1)
Lymphs: 38 %
MCH: 31.6 pg (ref 26.6–33.0)
MCHC: 35.4 g/dL (ref 31.5–35.7)
MCV: 89 fL (ref 79–97)
Monocytes Absolute: 0.4 10*3/uL (ref 0.1–0.9)
Monocytes: 11 %
Neutrophils Absolute: 1.7 10*3/uL (ref 1.4–7.0)
Neutrophils: 46 %
Platelets: 212 10*3/uL (ref 150–450)
RBC: 4.37 x10E6/uL (ref 3.77–5.28)
RDW: 12.7 % (ref 11.7–15.4)
WBC: 3.8 10*3/uL (ref 3.4–10.8)

## 2020-06-10 LAB — LIPID PANEL
Chol/HDL Ratio: 2.2 ratio (ref 0.0–4.4)
Cholesterol, Total: 165 mg/dL (ref 100–199)
HDL: 74 mg/dL (ref >39)
LDL Chol Calc (NIH): 78 mg/dL (ref 0–99)
Triglycerides: 65 mg/dL (ref 0–149)
VLDL Cholesterol Cal: 13 mg/dL (ref 5–40)

## 2020-06-10 LAB — COMPREHENSIVE METABOLIC PANEL
ALT: 18 IU/L (ref 0–32)
AST: 25 IU/L (ref 0–40)
Albumin/Globulin Ratio: 2.6 — ABNORMAL HIGH (ref 1.2–2.2)
Albumin: 4.4 g/dL (ref 3.7–4.7)
Alkaline Phosphatase: 85 IU/L (ref 48–121)
BUN/Creatinine Ratio: 22 (ref 12–28)
BUN: 19 mg/dL (ref 8–27)
Bilirubin Total: 0.7 mg/dL (ref 0.0–1.2)
CO2: 25 mmol/L (ref 20–29)
Calcium: 9.6 mg/dL (ref 8.7–10.3)
Chloride: 103 mmol/L (ref 96–106)
Creatinine, Ser: 0.86 mg/dL (ref 0.57–1.00)
GFR calc Af Amer: 78 mL/min/{1.73_m2} (ref >59)
GFR calc non Af Amer: 68 mL/min/{1.73_m2} (ref >59)
Globulin, Total: 1.7 g/dL (ref 1.5–4.5)
Glucose: 87 mg/dL (ref 65–99)
Potassium: 4.5 mmol/L (ref 3.5–5.2)
Sodium: 141 mmol/L (ref 134–144)
Total Protein: 6.1 g/dL (ref 6.0–8.5)

## 2020-06-10 LAB — CARDIOVASCULAR RISK ASSESSMENT

## 2020-06-15 ENCOUNTER — Other Ambulatory Visit: Payer: Self-pay | Admitting: Family Medicine

## 2020-06-21 ENCOUNTER — Encounter: Payer: Self-pay | Admitting: Orthopedic Surgery

## 2020-06-21 ENCOUNTER — Ambulatory Visit (INDEPENDENT_AMBULATORY_CARE_PROVIDER_SITE_OTHER): Payer: Medicare Other

## 2020-06-21 ENCOUNTER — Ambulatory Visit: Payer: Self-pay

## 2020-06-21 ENCOUNTER — Ambulatory Visit (INDEPENDENT_AMBULATORY_CARE_PROVIDER_SITE_OTHER): Payer: Medicare Other | Admitting: Orthopedic Surgery

## 2020-06-21 VITALS — Ht 60.0 in | Wt 184.0 lb

## 2020-06-21 DIAGNOSIS — M79672 Pain in left foot: Secondary | ICD-10-CM | POA: Diagnosis not present

## 2020-06-21 DIAGNOSIS — M76821 Posterior tibial tendinitis, right leg: Secondary | ICD-10-CM | POA: Diagnosis not present

## 2020-06-21 DIAGNOSIS — M79671 Pain in right foot: Secondary | ICD-10-CM

## 2020-06-21 DIAGNOSIS — G8929 Other chronic pain: Secondary | ICD-10-CM

## 2020-06-21 DIAGNOSIS — M25572 Pain in left ankle and joints of left foot: Secondary | ICD-10-CM

## 2020-06-21 DIAGNOSIS — M25571 Pain in right ankle and joints of right foot: Secondary | ICD-10-CM

## 2020-06-21 DIAGNOSIS — M2021 Hallux rigidus, right foot: Secondary | ICD-10-CM

## 2020-06-23 ENCOUNTER — Encounter: Payer: Self-pay | Admitting: Orthopedic Surgery

## 2020-06-23 NOTE — Progress Notes (Signed)
Office Visit Note   Patient: Melanie Hamilton           Date of Birth: Dec 17, 1946           MRN: [id number] Visit Date: 06/21/2020              Requested by: Rochel Brome, MD [address],  [place] [address] PCP: Rochel Brome, MD  Chief Complaint  Patient presents with  . Right Ankle - Pain  . Left Ankle - Pain  . Right Foot - Pain  . Left Foot - Pain      HPI: Patient is a 74 year old woman who presents complaining of pain laterally over the ankle and across the top of the foot bilaterally.  Patient complains of swelling in the foot by the end of the day patient is questioning whether 1 leg is longer than the other after knee replacement surgery.   Assessment & Plan: Visit Diagnoses:  1. Chronic pain of both ankles   2. Bilateral foot pain   3. Posterior tibial tendinitis, right leg   4. Hallux rigidus, right foot     Plan: Discussed the patient has posterior tibial tendon insufficiency on the right foot with it is causing impingement laterally over the sinus Tarsi discussed that ankle braces and a stiff soled shoe could be helpful.  The surgical intervention would be a talonavicular and subtalar fusion.  Patient states she is not interested in surgery at this time.  Follow-Up Instructions: No follow-ups on file.   Ortho Exam  Patient is alert, oriented, no adenopathy, well-dressed, normal affect, normal respiratory effort. Examination patient has good pulses bilaterally.  On the right foot she has pain to palpation over the sinus Tarsi she has pronation and valgus of the foot with pes planus with posterior tibial tendon insufficiency.  She has ankle pain inferior to the fibula but not anteriorly over the joint line.  She does have decreased range of motion of the great toe with osteoarthritis across the MTP joint of the great toe.  Semination of the left foot she has less deformity she has good ankle and subtalar motion she has overlapping of the left great  toe and second toe with previous bunion surgery the left ankle is asymptomatic.  Patient has no ulcers or calluses from the overlapping of the great toe and second toe left foot.  Imaging: No results found. No images are attached to the encounter.  Labs: Lab Results  Component Value Date   REPTSTATUS 12/19/2008 FINAL 12/17/2008   CULT  12/17/2008    Multiple bacterial morphotypes present, none predominant. Suggest appropriate recollection if clinically indicated.     Lab Results  Component Value Date   ALBUMIN 4.4 06/09/2020   ALBUMIN 4.1 12/17/2008    No results found for: MG No results found for: VD25OH  No results found for: PREALBUMIN CBC EXTENDED Latest Ref Rng & Units 06/09/2020 12/23/2008 12/22/2008  WBC 3.4 - 10.8 x10E3/uL 3.8 6.4 8.7  RBC 3.77 - 5.28 x10E6/uL 4.37 2.85(L) 3.16(L)  HGB 11.1 - 15.9 g/dL 13.8 9.3(L) 10.3(L)  HCT 34.0 - 46.6 % 39.0 26.9(L) 30.1(L)  PLT 150 - 450 x10E3/uL 212 173 205  NEUTROABS 1 - 7 x10E3/uL 1.7 - -  LYMPHSABS 0 - 3 x10E3/uL 1.4 - -     Body mass index is 35.94 kg/m.  Orders:  Orders Placed This Encounter  Procedures  . XR Foot Complete Left  . XR Foot Complete Right  .  XR Ankle 2 Views Left  . XR Ankle 2 Views Right   No orders of the defined types were placed in this encounter.    Procedures: No procedures performed  Clinical Data: No additional findings.  ROS:  All other systems negative, except as noted in the HPI. Review of Systems  Objective: Vital Signs: Ht 5' (1.524 m)   Wt 184 lb (83.5 kg)   LMP 12/26/1987   BMI 35.94 kg/m   Specialty Comments:  No specialty comments available.  PMFS History: Patient Active Problem List   Diagnosis Date Noted  . Right foot pain 06/09/2020  . Mixed hyperlipidemia 06/09/2020  . Osteoporosis 04/29/2020  . Abnormal colonoscopy 08/14/2013   Past Medical History:  Diagnosis Date  . Cancer [facility]) 1989   breast cancer/ chemo therapy/ mastectomy and reconstruction    . Chemotherapy adverse reaction    breast cancer-1989  . Diverticulosis 2007  . GERD (gastroesophageal reflux disease)   . Hiatal hernia   . HX: breast cancer 1989  . Hyperplastic colon polyp 11/2001  . Mixed hyperlipidemia   . Osteoarthritis   . Shingles 5/20    Family History  Problem Relation Age of Onset  . Cancer Brother        colon cancer  . Osteoarthritis Mother   . Hypertension Mother   . Heart failure Father   . Cancer Maternal Grandfather   . Heart failure Paternal Grandmother     Past Surgical History:  Procedure Laterality Date  . BREAST SURGERY  1989   reconstruction   . bunion removed  01/2005  . carpel tunnel release  1994  . COLONOSCOPY  2007  . KNEE SURGERY Right   . REPLACEMENT TOTAL KNEE Right 11/2008  . ROTATOR CUFF REPAIR  08/2008   Social History   Occupational History  . Not on file  Tobacco Use  . Smoking status: Never Smoker  . Smokeless tobacco: Never Used  Substance and Sexual Activity  . Alcohol use: No  . Drug use: No  . Sexual activity: Never    Birth control/protection: Post-menopausal

## 2020-07-01 ENCOUNTER — Other Ambulatory Visit: Payer: Self-pay | Admitting: Family Medicine

## 2020-08-11 ENCOUNTER — Encounter: Payer: Self-pay | Admitting: Family Medicine

## 2020-08-11 ENCOUNTER — Ambulatory Visit (INDEPENDENT_AMBULATORY_CARE_PROVIDER_SITE_OTHER): Payer: Medicare Other | Admitting: Family Medicine

## 2020-08-11 ENCOUNTER — Other Ambulatory Visit: Payer: Self-pay

## 2020-08-11 VITALS — BP 106/62 | HR 84 | Temp 97.1°F | Ht 61.0 in | Wt 183.0 lb

## 2020-08-11 DIAGNOSIS — L03818 Cellulitis of other sites: Secondary | ICD-10-CM

## 2020-08-11 DIAGNOSIS — L0390 Cellulitis, unspecified: Secondary | ICD-10-CM | POA: Insufficient documentation

## 2020-08-11 MED ORDER — SULFAMETHOXAZOLE-TRIMETHOPRIM 800-160 MG PO TABS: 1.0000 | ORAL_TABLET | Freq: Two times a day (BID) | ORAL | 0 refills | Status: DC

## 2020-08-11 NOTE — Progress Notes (Signed)
Acute Office Visit  Subjective:    Patient ID: Melanie Hamilton, female    DOB: Mar 22, 1946, 74 y.o.   MRN: [id number]  Cc:  Bump noted   HPI Patient is in today for right elbow pain. Ran a slight fever yesterday (100.6). Complain that her elbow is red and swollen, noticed a bump on her elbow yesterday but is unsure if it has anything to do with her elbow. Pt states she popped papules on the elbow. She denies any pain with movement and some mild tenderness on her elbow. States she took Amoxicillin 2 tablets (1000 mg) yesterday evening, 1 tablet last night and 1 this a.m. but the rx is expired. She also took a benadryl last night.   Past Medical History:  Diagnosis Date  . Cancer [facility]) 1989   breast cancer/ chemo therapy/ mastectomy and reconstruction  . Chemotherapy adverse reaction    breast cancer-1989  . Diverticulosis 2007  . GERD (gastroesophageal reflux disease)   . Hiatal hernia   . HX: breast cancer 1989  . Hyperplastic colon polyp 11/2001  . Mixed hyperlipidemia   . Osteoarthritis   . Shingles 5/20    Past Surgical History:  Procedure Laterality Date  . BREAST SURGERY  1989   reconstruction   . bunion removed  01/2005  . carpel tunnel release  1994  . COLONOSCOPY  2007  . KNEE SURGERY Right   . REPLACEMENT TOTAL KNEE Right 11/2008  . ROTATOR CUFF REPAIR  08/2008    Family History  Problem Relation Age of Onset  . Cancer Brother        colon cancer  . Osteoarthritis Mother   . Hypertension Mother   . Heart failure Father   . Cancer Maternal Grandfather   . Heart failure Paternal Grandmother     Social History   Socioeconomic History  . Marital status: Widowed    Spouse name: Not on file  . Number of children: 2  . Years of education: Not on file  . Highest education level: Not on file  Occupational History  . Not on file  Tobacco Use  . Smoking status: Never Smoker  . Smokeless tobacco: Never Used  Substance and Sexual Activity  . Alcohol use: No    . Drug use: No  . Sexual activity: Never    Birth control/protection: Post-menopausal  Other Topics Concern  . Not on file  Social History Narrative  . Not on file   Social Determinants of Health   Financial Resource Strain:   . Difficulty of Paying Living Expenses:   Food Insecurity:   . Worried About Charity fundraiser in the Last Year:   . Arboriculturist in the Last Year:   Transportation Needs:   . Film/video editor (Medical):   [name] Lack of Transportation (Non-Medical):   Physical Activity:   . Days of Exercise per Week:   . Minutes of Exercise per Session:   Stress:   . Feeling of Stress :   Social Connections:   . Frequency of Communication with Friends and Family:   . Frequency of Social Gatherings with Friends and Family:   . Attends Religious Services:   . Active Member of Clubs or Organizations:   . Attends Archivist Meetings:   [name] Marital Status:   Intimate Partner Violence:   . Fear of Current or Ex-Partner:   . Emotionally Abused:   [name] Physically Abused:   . Sexually Abused:  Outpatient Medications Prior to Visit  Medication Sig Dispense Refill  . aspirin 81 MG tablet Take 81 mg by mouth daily.    [name] atorvastatin (LIPITOR) 20 MG tablet TAKE 1 TABLET BY MOUTH AT NIGHT 90 tablet 0  . calcium carbonate (OS-CAL) 600 MG TABS Take 600 mg by mouth in the morning and at bedtime.     . cholecalciferol (VITAMIN D) 1000 UNITS tablet Take 1,000 Units by mouth daily.    . fish oil-omega-3 fatty acids 1000 MG capsule Take 1 g by mouth daily.     [name] ibuprofen (ADVIL,MOTRIN) 100 MG tablet Take 100 mg by mouth as needed for fever.     . meloxicam (MOBIC) 7.5 MG tablet TAKE 1 TABLET BY MOUTH ONCE TO TWICE DAILY FOR SHOULDER PAIN 180 tablet 0  . Multiple Vitamin (MULTIVITAMIN) capsule Take 1 capsule by mouth daily.    . naproxen sodium (ANAPROX) 220 MG tablet Take 220 mg by mouth as needed.     [name] omeprazole (PRILOSEC) 40 MG capsule Take 1 capsule by mouth  once daily 90 capsule 0  . raloxifene (EVISTA) 60 MG tablet Take 1 tablet by mouth once daily 90 tablet 0   No facility-administered medications prior to visit.    No Known Allergies  Review of Systems  Constitutional: Positive for fever. Negative for chills.  HENT: Negative for congestion, ear pain and sore throat.   Respiratory: Negative for cough and shortness of breath.   Cardiovascular: Negative for chest pain.  Musculoskeletal:       Joint stiffness  due to swelling -skin color change with tenderness to touch on elbow joint  Skin: Positive for color change.       Right elbow  Neurological: Positive for headaches.       Objective:     Today's Vitals   08/11/20 1039  BP: 106/62  Pulse: 84  Temp: (!) 97.1 F (36.2 C)  SpO2: 99%  Weight: 183 lb (83 kg)  Height: 5\' 1"  (1.549 m)   Body mass index is 34.58 kg/m. Physical Exam Constitutional:      Appearance: Normal appearance.  Musculoskeletal:        General: Swelling and tenderness present.       Arms:     Comments: Red and swollen  Neurological:     Mental Status: She is alert.     LMP 12/26/1987  Wt Readings from Last 3 Encounters:  06/21/20 184 lb (83.5 kg)  06/09/20 184 lb (83.5 kg)  04/29/20 185 lb (83.9 kg)    Health Maintenance Due  Topic Date Due  . Hepatitis C Screening  Never done  . COLONOSCOPY  08/13/2018  . TETANUS/TDAP  02/16/2020  . INFLUENZA VACCINE  07/25/2020    There are no preventive care reminders to display for this patient.   No results found for: TSH Lab Results  Component Value Date   WBC 3.8 06/09/2020   HGB 13.8 06/09/2020   HCT 39.0 06/09/2020   MCV 89 06/09/2020   PLT 212 06/09/2020   Lab Results  Component Value Date   NA 141 06/09/2020   K 4.5 06/09/2020   CO2 25 06/09/2020   GLUCOSE 87 06/09/2020   BUN 19 06/09/2020   CREATININE 0.86 06/09/2020   BILITOT 0.7 06/09/2020   ALKPHOS 85 06/09/2020   AST 25 06/09/2020   ALT 18 06/09/2020   PROT 6.1  06/09/2020   ALBUMIN 4.4 06/09/2020   CALCIUM 9.6 06/09/2020   Lab Results  Component Value Date   CHOL 165 06/09/2020   Lab Results  Component Value Date   HDL 74 06/09/2020   Lab Results  Component Value Date   LDLCALC 78 06/09/2020   Lab Results  Component Value Date   TRIG 65 06/09/2020   Lab Results  Component Value Date   CHOLHDL 2.2 06/09/2020   No results found for: HGBA1C     Assessment & Plan:  1. Cellulitis of other specified site-right elbow-Bactrim-no sulfa allergies-discussed with patient-received sulfa medications in the past Follow-up: if no improvement-re-assessment at 48 hours-pt to mark boundaries of skin redness to continue assessment at home and return if no improvement An After Visit Summary was printed and given to the patient.  [place] [phone]

## 2020-08-11 NOTE — Patient Instructions (Signed)

## 2020-08-13 ENCOUNTER — Telehealth: Payer: Self-pay

## 2020-08-13 NOTE — Telephone Encounter (Signed)
Melanie Hamilton called to report that he fever has resolved but she continues to have redness and swelling.  She can see a definite improvement but is a little concerned that she continues to have redness.  Dr. Tobie Poet advised that she continue the antibiotic and recheck in the office on Monday if needed.  If her symptoms worsen over the week-end she was instructed to follow-up in the Urgent Care.

## 2020-08-16 ENCOUNTER — Telehealth: Payer: Self-pay

## 2020-08-16 NOTE — Telephone Encounter (Signed)
I called to follow-up with Melanie Hamilton.  She continues to improve with less redness and swelling.  She is going to complete the medication as directed and call us back if symptoms do not clear.

## 2020-08-20 ENCOUNTER — Other Ambulatory Visit: Payer: Self-pay

## 2020-08-20 MED ORDER — RALOXIFENE HCL 60 MG PO TABS: 60.0000 mg | ORAL_TABLET | Freq: Every day | ORAL | 1 refills | Status: DC

## 2020-08-20 MED ORDER — OMEPRAZOLE 40 MG PO CPDR: 40.0000 mg | DELAYED_RELEASE_CAPSULE | Freq: Every day | ORAL | 0 refills | Status: DC

## 2020-09-09 ENCOUNTER — Other Ambulatory Visit: Payer: Self-pay | Admitting: Physician Assistant

## 2020-09-17 DIAGNOSIS — Z1159 Encounter for screening for other viral diseases: Secondary | ICD-10-CM | POA: Diagnosis not present

## 2020-09-22 DIAGNOSIS — Z800 Family history of malignant neoplasm of digestive organs: Secondary | ICD-10-CM | POA: Diagnosis not present

## 2020-10-09 ENCOUNTER — Other Ambulatory Visit: Payer: Self-pay | Admitting: Physician Assistant

## 2020-10-12 DIAGNOSIS — Z23 Encounter for immunization: Secondary | ICD-10-CM | POA: Diagnosis not present

## 2020-10-26 ENCOUNTER — Telehealth: Payer: Self-pay | Admitting: Family Medicine

## 2020-10-26 NOTE — Chronic Care Management (AMB) (Signed)
  Chronic Care Management   Note  10/26/2020 Name: Melanie Hamilton MRN: [id number] DOB: 1946/03/12  [name] is a 74 y.o. year old female who is a primary care patient of Cox, Kirsten, MD. I reached out to [name] by phone today in response to a referral sent by Ms. Genice Rouge PCP, Cox, Kirsten, MD.   [name] was given information about Chronic Care Management services today including:  1. CCM service includes personalized support from designated clinical staff supervised by her physician, including individualized plan of care and coordination with other care providers 2. 24/7 contact phone numbers for assistance for urgent and routine care needs. 3. Service will only be billed when office clinical staff spend 20 minutes or more in a month to coordinate care. 4. Only one practitioner may furnish and bill the service in a calendar month. 5. The patient may stop CCM services at any time (effective at the end of the month) by phone call to the office staff.   Patient agreed to services and verbal consent obtained.   Follow up plan:   [place]

## 2020-10-26 NOTE — Progress Notes (Signed)
  Chronic Care Management   Outreach Note  10/26/2020 Name: Melanie Hamilton MRN: [id number] DOB: 1946-01-10  Referred by: Rochel Brome, MD Reason for referral : Chronic Care Management   An unsuccessful telephone outreach was attempted today. The patient was referred to the pharmacist for assistance with care management and care coordination.   Follow Up Plan:   Hilario Quarry  Upstream Scheduler

## 2020-11-24 NOTE — Chronic Care Management (AMB) (Signed)
Chronic Care Management Pharmacy  Name: Melanie Hamilton  MRN: [id number] DOB: 06/04/1946  Chief Complaint/ HPI  Melanie Hamilton,  74 y.o. , female presents for their Initial CCM visit with the clinical pharmacist via telephone due to COVID-19 Pandemic.  PCP : Rochel Brome, MD  Their chronic conditions include: osteoporosis, mixed hyperlipidemia, GERD, osteoarthritis.   Plan Recommendations:   Patient's cost for raloxifene is quite high. Pharmacist working to reduce tier with [employer]   Discussed using acetaminophen for breakthrough osteoarthritis pain vs. naproxen and ibuprofen since taking meloxicam.   Office Visits: 08/11/2020 - cellulitis of elbow. Bactrim prescribed.  06/09/2020 - continue current treatment. Labs drawn.  Consult Visit: 06/21/2020 - Ortho - chronic pain of both ankles and feet. Patient declines surgery at this time. Ankle braces and a stiff soled shoe.  Medications: Outpatient Encounter Medications as of 11/29/2020  Medication Sig  . aspirin 81 MG tablet Take 81 mg by mouth daily.  [name] atorvastatin (LIPITOR) 20 MG tablet TAKE 1 TABLET BY MOUTH AT NIGHT  . Biotin 5000 MCG CAPS Take 1 capsule by mouth in the morning and at bedtime.  . calcium carbonate (OS-CAL) 600 MG TABS Take 600 mg by mouth in the morning and at bedtime.   . calcium carbonate (TUMS - DOSED IN MG ELEMENTAL CALCIUM) 500 MG chewable tablet Chew 1 tablet by mouth daily as needed for indigestion or heartburn.  . Chlorpheniramine Maleate (CHLOR-TABLETS PO) Take 1 tablet by mouth in the morning and at bedtime.  . cholecalciferol (VITAMIN D) 1000 UNITS tablet Take 1,000 Units by mouth daily.  . fish oil-omega-3 fatty acids 1000 MG capsule Take 1 g by mouth daily.   . meloxicam (MOBIC) 7.5 MG tablet TAKE 1 TABLET BY MOUTH ONCE TO TWICE DAILY FOR SHOULDER PAIN  . Multiple Vitamin (MULTIVITAMIN) capsule Take 1 capsule by mouth daily.  [name] omeprazole (PRILOSEC) 40 MG capsule TAKE 1 CAPSULE EVERY DAY  .  raloxifene (EVISTA) 60 MG tablet Take 1 tablet (60 mg total) by mouth daily.  [name] ibuprofen (ADVIL,MOTRIN) 100 MG tablet Take 100 mg by mouth as needed for fever.  (Patient not taking: Reported on 11/29/2020)  . naproxen sodium (ANAPROX) 220 MG tablet Take 220 mg by mouth as needed.  (Patient not taking: Reported on 11/29/2020)  . sulfamethoxazole-trimethoprim (BACTRIM DS) 800-160 MG tablet Take 1 tablet by mouth 2 (two) times daily. (Patient not taking: Reported on 11/29/2020)  . [DISCONTINUED] omeprazole (PRILOSEC) 40 MG capsule Take 1 capsule (40 mg total) by mouth daily.   No facility-administered encounter medications on file as of 11/29/2020.   No Known Allergies  SDOH Screenings   Alcohol Screen:   . Last Alcohol Screening Score (AUDIT): Not on file  Depression (PHQ2-9): Low Risk   . PHQ-2 Score: 0  Financial Resource Strain:   . Difficulty of Paying Living Expenses: Not on file  Food Insecurity: No Food Insecurity  . Worried About Charity fundraiser in the Last Year: Never true  . Ran Out of Food in the Last Year: Never true  Housing: Low Risk   . Last Housing Risk Score: 0  Physical Activity:   . Days of Exercise per Week: Not on file  . Minutes of Exercise per Session: Not on file  Social Connections:   . Frequency of Communication with Friends and Family: Not on file  . Frequency of Social Gatherings with Friends and Family: Not on file  . Attends Religious Services: Not on file  .  Active Member of Clubs or Organizations: Not on file  . Attends Archivist Meetings: Not on file  . Marital Status: Not on file  Stress:   . Feeling of Stress : Not on file  Tobacco Use: Low Risk   . Smoking Tobacco Use: Never Smoker  . Smokeless Tobacco Use: Never Used  Transportation Needs: No Transportation Needs  . Lack of Transportation (Medical): No  . Lack of Transportation (Non-Medical): No    Current Diagnosis/Assessment:  Goals Addressed            This Visit's  Progress   . Pharmacy Care Plan       CARE PLAN ENTRY (see longitudinal plan of care for additional care plan information)  Current Barriers:  . Chronic Disease Management support, education, and care coordination needs related to Hyperlipidemia and Osteoporosis   Hyperlipidemia Lab Results  Component Value Date/Time   LDLCALC 78 06/09/2020 08:40 AM   . Pharmacist Clinical Goal(s): o Over the next 90 days, patient will work with PharmD and providers to maintain LDL goal < 100 . Current regimen:  . aspirin 81 mg daily  . Atorvastatin 20 mg daily  . Fish oil omega 3 1000 mg daily . Interventions: o Discussed diet and lifestyle.  o Reviewed most recent lipid panel and adherence to medications.  . Patient self care activities - Over the next 90 days, patient will: o Continue taking medications as prescribed.   Osteoporosis . Pharmacist Clinical Goal(s) o Over the next 90 days, patient will work with PharmD and providers to reduce risks of osteoporosis . Current regimen:  . calcium 600 mg am and bedtime . Vitamin D 1000 units daily  . Evista 60 mg daily  . Interventions: o Reviewed patient's medications.  o Discussed importance of muscle strengthening and weight bearing exercise.  o [place] to request a tier exception to make raloxifene more affordable.  . Patient self care activities - Over the next 90 days, patient will: o Continue current medications and supplements.  o Continue to stay active and complete weight bearing exercise as tolerated.   Medication management . Pharmacist Clinical Goal(s): o Over the next 90 days, patient will work with PharmD and providers to maintain optimal medication adherence . Current pharmacy: [facility] . Interventions o Comprehensive medication review performed. o Continue current medication management strategy . Patient self care activities - Over the next 90 days, patient will: o Focus on medication  adherence by using pill box. o Take medications as prescribed o Report any questions or concerns to PharmD and/or provider(s)  Initial goal documentation        Hyperlipidemia   LDL goal < 100  Last lipids Lab Results  Component Value Date   CHOL 165 06/09/2020   HDL 74 06/09/2020   LDLCALC 78 06/09/2020   TRIG 65 06/09/2020   CHOLHDL 2.2 06/09/2020   Hepatic Function Latest Ref Rng & Units 06/09/2020 12/17/2008  Total Protein 6.0 - 8.5 g/dL 6.1 6.5  Albumin 3.7 - 4.7 g/dL 4.4 4.1  AST 0 - 40 IU/L 25 20  ALT 0 - 32 IU/L 18 18  Alk Phosphatase 48 - 121 IU/L 85 101  Total Bilirubin 0.0 - 1.2 mg/dL 0.7 0.7     The 10-year ASCVD risk score Mikey Bussing DC Jr., et al., 2013) is: 8.7%   Values used to calculate the score:     Age: 8 years     Sex: Female  Is Non-Hispanic African American: No     Diabetic: No     Tobacco smoker: No     Systolic Blood Pressure: 378 mmHg     Is BP treated: No     HDL Cholesterol: 74 mg/dL     Total Cholesterol: 165 mg/dL   Patient has failed these meds in past: non applicable Patient is currently controlled on the following medications:  . Aspirin 81 mg daily  . Atorvastatin 20 mg daily  . Fish oil omega 3 1000 mg daily  We discussed:  diet and exercise extensively  Diet: typically eats a quick meal in the morning. Eats a piece of toast with peanut butter in the morning.   Exercise:  Stays active working in the yard and house projects. Having foot pain that limits walking at the mall.    Plan  Continue current medications  Osteopenia / Osteoporosis   Last DEXA Scan: 01/2020  T-Score femoral neck: -1.5  T-Score forearm radius: -1.2   No results found for: VD25OH   Patient is a candidate for pharmacologic treatment due to history of wrist fracture  Patient has failed these meds in past: none reported Patient is currently controlled on the following medications:  . calcium 600 mg am and bedtime . Vitamin D 1000 units daily   . Evista 60 mg daily   We discussed:  Recommend [phone] units of vitamin D daily. Recommend 1200 mg of calcium daily from dietary and supplemental sources. Recommend weight-bearing and muscle strengthening exercises for building and maintaining bone density.   Patient had a history of breast cancer. She completed Tamoxifen for 7.5 years post breast cancer. Evista is so expensive that she stopped taking it in the past due to cost. She is currently taking raloxifene and receiving through mail order but expensive.   Plan  Continue current medications and control with diet and exercise   GERD   Patient has failed these meds in past: none reported Patient is currently controlled on the following medications:  . omeprazole 40 mg daily  . Tums daily prn for reflux symptoms  We discussed:  Patient cannot tolerate going without medication. Is symptomatic drinking water without treatment. Takes Tums as needed for breakthrough. Symptoms come back immediately with missed doses. Avoids fried and fatty foods. Sleeps propped up at night. Avoids eating late at night. She reports that she sometimes eats too late at night which worsens symptoms. Encouraged small frequent meals and avoiding tight waistbands.   Plan  Continue current medications   Osteoarthritis   Patient has failed these meds in past: none reported Patient is currently controlled on the following medications:  [name] Meloxicam 7.5 mg bid shoulder pain . Ibuprofen OTC prn . Alleve OTC prn   We discussed:  Patient was previously using OTC ibuprofen and naproxen for osteoarthritis pain. Advised patient to use acetaminophen for breakthrough pain while taking meloxicam to avoid duplication of anti-inflammatory treatment. Patient acknowledges understanding.   Plan  Continue current medications   Health Maintenance   Patient is currently controlled on the following medications:  [name] Multivitamin daily - daily  . Biotin 5000 mcg twice  daily - supplementation .   We discussed:  Patient reports that she experienced hair loss after loss of her husband last year. She feels the biotin is helping to improve that.   Plan  Continue current medications  Vaccines   Reviewed and discussed patient's vaccination history.  Has had third COVID shot - 09/23/2020.  Immunization History  Administered Date(s) Administered  . DTaP 12/25/2008  . Influenza, High Dose Seasonal PF 10/16/2018  . Influenza-Unspecified 12/25/2018, 10/12/2020  . PFIZER SARS-COV-2 Vaccination 01/10/2020, 01/31/2020, 09/23/2020  . Pneumococcal Conjugate-13 02/26/2015  . Pneumococcal Polysaccharide-23 11/09/2017  . Pneumococcal-Unspecified 12/25/2013  . Td 02/15/2010  . Tdap 08/13/2020  . Zoster 10/21/2014  . Zoster Recombinat (Shingrix) 09/24/2017, 12/08/2017    Plan  Patient is up to date on vaccines at this time.    Medication Management   Patient's preferred pharmacy is:  [facility] [place] [address] [place] [address] Phone: [phone] Fax: [phone]  [facility] [place], [place] [address] Phone: [phone] Fax: [phone]  Uses pill box? Yes Pt endorses good compliance  We discussed: Current pharmacy is preferred with insurance plan and patient is satisfied with pharmacy services  Plan  Continue current medication management strategy    Follow up: 6 month phone visit

## 2020-11-25 ENCOUNTER — Telehealth: Payer: Self-pay

## 2020-11-25 NOTE — Chronic Care Management (AMB) (Signed)
    Chronic Care Management Pharmacy Assistant   Name: Melanie Hamilton  MRN: [id number] DOB: 10/06/1946  Reason for Encounter: Medication Review    PCP : Rochel Brome, MD  Allergies:  No Known Allergies  Medications: Outpatient Encounter Medications as of 11/25/2020  Medication Sig  . aspirin 81 MG tablet Take 81 mg by mouth daily.  [name] atorvastatin (LIPITOR) 20 MG tablet TAKE 1 TABLET BY MOUTH AT NIGHT  . calcium carbonate (OS-CAL) 600 MG TABS Take 600 mg by mouth in the morning and at bedtime.   . cholecalciferol (VITAMIN D) 1000 UNITS tablet Take 1,000 Units by mouth daily.  . fish oil-omega-3 fatty acids 1000 MG capsule Take 1 g by mouth daily.   [name] ibuprofen (ADVIL,MOTRIN) 100 MG tablet Take 100 mg by mouth as needed for fever.   . meloxicam (MOBIC) 7.5 MG tablet TAKE 1 TABLET BY MOUTH ONCE TO TWICE DAILY FOR SHOULDER PAIN  . Multiple Vitamin (MULTIVITAMIN) capsule Take 1 capsule by mouth daily.  . naproxen sodium (ANAPROX) 220 MG tablet Take 220 mg by mouth as needed.   [name] omeprazole (PRILOSEC) 40 MG capsule Take 1 capsule (40 mg total) by mouth daily.  . raloxifene (EVISTA) 60 MG tablet Take 1 tablet (60 mg total) by mouth daily.  [name] sulfamethoxazole-trimethoprim (BACTRIM DS) 800-160 MG tablet Take 1 tablet by mouth 2 (two) times daily.   No facility-administered encounter medications on file as of 11/25/2020.    Current Diagnosis: Patient Active Problem List   Diagnosis Date Noted  . Cellulitis 08/11/2020  . Right foot pain 06/09/2020  . Mixed hyperlipidemia 06/09/2020  . Osteoporosis 04/29/2020  . Abnormal colonoscopy 08/14/2013   Have you seen any other providers since your last visit? Yes - 09/22/20 had colonoscopy at [facility]   Any changes in your medications or health? No  Any side effects from any medications? No  Do you have an symptoms or problems not managed by your medications? No  Any concerns about your health right now? Patient states that she  is concerned about feet. Notes after prolonged walking or standing she has bilateral foot pain. Right is worse than left.   Has your provider asked that you check blood pressure, blood sugar, or follow special diet at home? Patient states no.  Do you get any type of exercise on a regular basis? She states she is active around the house but would like to be able to walk more.   Can you think of a goal you would like to reach for your health? She states she would like to lose weight and become more active.   Do you have any problems getting your medications? No.  Is there anything that you would like to discuss during the appointment? No  Patient advised to have medications and supplements available at her appointment. She was reminded of her appointment date and time. Sara's number was also provided so patient would recognize number and answer when called.    Follow-Up:  Pharmacist Review   Theodosia Quay, CPP notified  Margaretmary Dys, [place] Pharmacy Assistant [phone]

## 2020-11-26 ENCOUNTER — Other Ambulatory Visit: Payer: Self-pay | Admitting: Family Medicine

## 2020-11-29 ENCOUNTER — Other Ambulatory Visit: Payer: Self-pay

## 2020-11-29 ENCOUNTER — Ambulatory Visit: Payer: Medicare Other

## 2020-11-29 DIAGNOSIS — M810 Age-related osteoporosis without current pathological fracture: Secondary | ICD-10-CM

## 2020-11-29 DIAGNOSIS — E782 Mixed hyperlipidemia: Secondary | ICD-10-CM

## 2020-11-29 NOTE — Patient Instructions (Signed)
Visit Information  Thank you for your time discussing your medications. I look forward to working with you to achieve your health care goals. Below is a summary of what we talked about during our visit.   Goals Addressed            This Visit's Progress   . Pharmacy Care Plan       CARE PLAN ENTRY (see longitudinal plan of care for additional care plan information)  Current Barriers:  . Chronic Disease Management support, education, and care coordination needs related to Hyperlipidemia and Osteoporosis   Hyperlipidemia Lab Results  Component Value Date/Time   LDLCALC 78 06/09/2020 08:40 AM   . Pharmacist Clinical Goal(s): o Over the next 90 days, patient will work with PharmD and providers to maintain LDL goal < 100 . Current regimen:  . aspirin 81 mg daily  . Atorvastatin 20 mg daily  . Fish oil omega 3 1000 mg daily . Interventions: o Discussed diet and lifestyle.  o Reviewed most recent lipid panel and adherence to medications.  . Patient self care activities - Over the next 90 days, patient will: o Continue taking medications as prescribed.   Osteoporosis . Pharmacist Clinical Goal(s) o Over the next 90 days, patient will work with PharmD and providers to reduce risks of osteoporosis . Current regimen:  . calcium 600 mg am and bedtime . Vitamin D 1000 units daily  . Evista 60 mg daily  . Interventions: o Reviewed patient's medications.  o Discussed importance of muscle strengthening and weight bearing exercise.  o [place] to request a tier exception to make raloxifene more affordable.  . Patient self care activities - Over the next 90 days, patient will: o Continue current medications and supplements.  o Continue to stay active and complete weight bearing exercise as tolerated.   Medication management . Pharmacist Clinical Goal(s): o Over the next 90 days, patient will work with PharmD and providers to maintain optimal medication adherence . Current  pharmacy: [facility] . Interventions o Comprehensive medication review performed. o Continue current medication management strategy . Patient self care activities - Over the next 90 days, patient will: o Focus on medication adherence by using pill box. o Take medications as prescribed o Report any questions or concerns to PharmD and/or provider(s)  Initial goal documentation        Ms. Hedstrom was given information about Chronic Care Management services today including:  1. CCM service includes personalized support from designated clinical staff supervised by her physician, including individualized plan of care and coordination with other care providers 2. 24/7 contact phone numbers for assistance for urgent and routine care needs. 3. Standard insurance, coinsurance, copays and deductibles apply for chronic care management only during months in which we provide at least 20 minutes of these services. Most insurances cover these services at 100%, however patients may be responsible for any copay, coinsurance and/or deductible if applicable. This service may help you avoid the need for more expensive face-to-face services. 4. Only one practitioner may furnish and bill the service in a calendar month. 5. The patient may stop CCM services at any time (effective at the end of the month) by phone call to the office staff.  Patient agreed to services and verbal consent obtained.   The patient verbalized understanding of instructions, educational materials, and care plan provided today and agreed to receive a mailed copy of patient instructions, educational materials, and care plan.  Telephone follow up  appointment with pharmacy team member scheduled for: 05/2021  Sherre Poot, PharmD Clinical Pharmacist [facility] [phone] (office) [phone] (mobile)  Exercises To Do While Sitting  Exercises that you do while sitting (chair exercises) can give you  many of the same benefits as full exercise. Benefits include strengthening your heart, burning calories, and keeping muscles and joints healthy. Exercise can also improve your mood and help with depression and anxiety. You may benefit from chair exercises if you are unable to do standing exercises because of:  Diabetic foot pain.  Obesity.  Illness.  Arthritis.  Recovery from surgery or injury.  Breathing problems.  Balance problems.  Another type of disability. Before starting chair exercises, check with your health care provider or a physical therapist to find out how much exercise you can tolerate and which exercises are safe for you. If your health care provider approves:  Start out slowly and build up over time. Aim to work up to about 10-20 minutes for each exercise session.  Make exercise part of your daily routine.  Drink water when you exercise. Do not wait until you are thirsty. Drink every 10-15 minutes.  Stop exercising right away if you have pain, nausea, shortness of breath, or dizziness.  If you are exercising in a wheelchair, make sure to lock the wheels.  Ask your health care provider whether you can do tai chi or yoga. Many positions in these mind-body exercises can be modified to do while seated. Warm-up Before starting other exercises: 1. Sit up as straight as you can. Have your knees bent at 90 degrees, which is the shape of the capital letter "L." Keep your feet flat on the floor. 2. Sit at the front edge of your chair, if you can. 3. Pull in (tighten) the muscles in your abdomen and stretch your spine and neck as straight as you can. Hold this position for a few minutes. 4. Breathe in and out evenly. Try to concentrate on your breathing, and relax your mind. Stretching Exercise A: Arm stretch 1. Hold your arms out straight in front of your body. 2. Bend your hands at the wrist with your fingers pointing up, as if signaling someone to stop. Notice the  slight tension in your forearms as you hold the position. 3. Keeping your arms out and your hands bent, rotate your hands outward as far as you can and hold this stretch. Aim to have your thumbs pointing up and your pinkie fingers pointing down. Slowly repeat arm stretches for one minute as tolerated. Exercise B: Leg stretch 1. If you can move your legs, try to "draw" letters on the floor with the toes of your foot. Write your name with one foot. 2. Write your name with the toes of your other foot. Slowly repeat the movements for one minute as tolerated. Exercise C: Reach for the sky 1. Reach your hands as far over your head as you can to stretch your spine. 2. Move your hands and arms as if you are climbing a rope. Slowly repeat the movements for one minute as tolerated. Range of motion exercises Exercise A: Shoulder roll 1. Let your arms hang loosely at your sides. 2. Lift just your shoulders up toward your ears, then let them relax back down. 3. When your shoulders feel loose, rotate your shoulders in backward and forward circles. Do shoulder rolls slowly for one minute as tolerated. Exercise B: March in place 1. As if you are marching, pump your arms  and lift your legs up and down. Lift your knees as high as you can. ? If you are unable to lift your knees, just pump your arms and move your ankles and feet up and down. March in place for one minute as tolerated. Exercise C: Seated jumping jacks 1. Let your arms hang down straight. 2. Keeping your arms straight, lift them up over your head. Aim to point your fingers to the ceiling. 3. While you lift your arms, straighten your legs and slide your heels along the floor to your sides, as wide as you can. 4. As you bring your arms back down to your sides, slide your legs back together. ? If you are unable to use your legs, just move your arms. Slowly repeat seated jumping jacks for one minute as tolerated. Strengthening exercises Exercise  A: Shoulder squeeze 1. Hold your arms straight out from your body to your sides, with your elbows bent and your fists pointed at the ceiling. 2. Keeping your arms in the bent position, move them forward so your elbows and forearms meet in front of your face. 3. Open your arms back out as wide as you can with your elbows still bent, until you feel your shoulder blades squeezing together. Hold for 5 seconds. Slowly repeat the movements forward and backward for one minute as tolerated. Contact a health care provider if you:  Had to stop exercising due to any of the following: ? Pain. ? Nausea. ? Shortness of breath. ? Dizziness. ? Fatigue.  Have significant pain or soreness after exercising. Get help right away if you have:  Chest pain.  Difficulty breathing. These symptoms may represent a serious problem that is an emergency. Do not wait to see if the symptoms will go away. Get medical help right away. Call your local emergency services (911 in the [place] Do not drive yourself to the hospital. This information is not intended to replace advice given to you by your health care provider. Make sure you discuss any questions you have with your health care provider. Document Revised: 04/03/2019 Document Reviewed: 10/24/2017 Elsevier Patient Education  [employer].  Exercises To Do While Sitting  Exercises that you do while sitting (chair exercises) can give you many of the same benefits as full exercise. Benefits include strengthening your heart, burning calories, and keeping muscles and joints healthy. Exercise can also improve your mood and help with depression and anxiety. You may benefit from chair exercises if you are unable to do standing exercises because of:  Diabetic foot pain.  Obesity.  Illness.  Arthritis.  Recovery from surgery or injury.  Breathing problems.  Balance problems.  Another type of disability. Before starting chair exercises, check with your  health care provider or a physical therapist to find out how much exercise you can tolerate and which exercises are safe for you. If your health care provider approves:  Start out slowly and build up over time. Aim to work up to about 10-20 minutes for each exercise session.  Make exercise part of your daily routine.  Drink water when you exercise. Do not wait until you are thirsty. Drink every 10-15 minutes.  Stop exercising right away if you have pain, nausea, shortness of breath, or dizziness.  If you are exercising in a wheelchair, make sure to lock the wheels.  Ask your health care provider whether you can do tai chi or yoga. Many positions in these mind-body exercises can be modified to do while seated.  Warm-up Before starting other exercises: 5. Sit up as straight as you can. Have your knees bent at 90 degrees, which is the shape of the capital letter "L." Keep your feet flat on the floor. 6. Sit at the front edge of your chair, if you can. 7. Pull in (tighten) the muscles in your abdomen and stretch your spine and neck as straight as you can. Hold this position for a few minutes. 8. Breathe in and out evenly. Try to concentrate on your breathing, and relax your mind. Stretching Exercise A: Arm stretch 4. Hold your arms out straight in front of your body. 5. Bend your hands at the wrist with your fingers pointing up, as if signaling someone to stop. Notice the slight tension in your forearms as you hold the position. 6. Keeping your arms out and your hands bent, rotate your hands outward as far as you can and hold this stretch. Aim to have your thumbs pointing up and your pinkie fingers pointing down. Slowly repeat arm stretches for one minute as tolerated. Exercise B: Leg stretch 3. If you can move your legs, try to "draw" letters on the floor with the toes of your foot. Write your name with one foot. 4. Write your name with the toes of your other foot. Slowly repeat the movements  for one minute as tolerated. Exercise C: Reach for the sky 3. Reach your hands as far over your head as you can to stretch your spine. 4. Move your hands and arms as if you are climbing a rope. Slowly repeat the movements for one minute as tolerated. Range of motion exercises Exercise A: Shoulder roll 4. Let your arms hang loosely at your sides. 5. Lift just your shoulders up toward your ears, then let them relax back down. 6. When your shoulders feel loose, rotate your shoulders in backward and forward circles. Do shoulder rolls slowly for one minute as tolerated. Exercise B: March in place 2. As if you are marching, pump your arms and lift your legs up and down. Lift your knees as high as you can. ? If you are unable to lift your knees, just pump your arms and move your ankles and feet up and down. March in place for one minute as tolerated. Exercise C: Seated jumping jacks 5. Let your arms hang down straight. 6. Keeping your arms straight, lift them up over your head. Aim to point your fingers to the ceiling. 7. While you lift your arms, straighten your legs and slide your heels along the floor to your sides, as wide as you can. 8. As you bring your arms back down to your sides, slide your legs back together. ? If you are unable to use your legs, just move your arms. Slowly repeat seated jumping jacks for one minute as tolerated. Strengthening exercises Exercise A: Shoulder squeeze 4. Hold your arms straight out from your body to your sides, with your elbows bent and your fists pointed at the ceiling. 5. Keeping your arms in the bent position, move them forward so your elbows and forearms meet in front of your face. 6. Open your arms back out as wide as you can with your elbows still bent, until you feel your shoulder blades squeezing together. Hold for 5 seconds. Slowly repeat the movements forward and backward for one minute as tolerated. Contact a health care provider if you:  Had  to stop exercising due to any of the following: ? Pain. ? Nausea. ? Shortness  of breath. ? Dizziness. ? Fatigue.  Have significant pain or soreness after exercising. Get help right away if you have:  Chest pain.  Difficulty breathing. These symptoms may represent a serious problem that is an emergency. Do not wait to see if the symptoms will go away. Get medical help right away. Call your local emergency services (911 in the [place] Do not drive yourself to the hospital. This information is not intended to replace advice given to you by your health care provider. Make sure you discuss any questions you have with your health care provider. Document Revised: 04/03/2019 Document Reviewed: 10/24/2017 Elsevier Patient Education  2020 [employer].

## 2020-11-30 ENCOUNTER — Telehealth: Payer: Self-pay

## 2020-11-30 NOTE — Telephone Encounter (Signed)
PA submitted for Raloxifene and was approved through 12/24/2020

## 2020-12-02 DIAGNOSIS — H524 Presbyopia: Secondary | ICD-10-CM | POA: Diagnosis not present

## 2020-12-02 DIAGNOSIS — H25013 Cortical age-related cataract, bilateral: Secondary | ICD-10-CM | POA: Diagnosis not present

## 2020-12-02 DIAGNOSIS — H35372 Puckering of macula, left eye: Secondary | ICD-10-CM | POA: Diagnosis not present

## 2020-12-02 DIAGNOSIS — H2513 Age-related nuclear cataract, bilateral: Secondary | ICD-10-CM | POA: Diagnosis not present

## 2020-12-02 DIAGNOSIS — H18413 Arcus senilis, bilateral: Secondary | ICD-10-CM | POA: Diagnosis not present

## 2020-12-21 ENCOUNTER — Other Ambulatory Visit: Payer: Self-pay | Admitting: Physician Assistant

## 2020-12-22 ENCOUNTER — Other Ambulatory Visit: Payer: Self-pay | Admitting: Physician Assistant

## 2021-01-03 ENCOUNTER — Other Ambulatory Visit: Payer: Self-pay | Admitting: Family Medicine

## 2021-01-13 ENCOUNTER — Other Ambulatory Visit: Payer: Self-pay

## 2021-01-13 MED ORDER — ATORVASTATIN CALCIUM 20 MG PO TABS: 20.0000 mg | ORAL_TABLET | Freq: Every day | ORAL | 0 refills | Status: DC

## 2021-01-13 MED ORDER — MELOXICAM 7.5 MG PO TABS
ORAL_TABLET | ORAL | 2 refills | Status: DC
Start: 2021-01-13 — End: 2021-01-17

## 2021-01-17 ENCOUNTER — Other Ambulatory Visit: Payer: Self-pay

## 2021-01-17 MED ORDER — ATORVASTATIN CALCIUM 20 MG PO TABS: 20.0000 mg | ORAL_TABLET | Freq: Every day | ORAL | 0 refills | Status: DC

## 2021-01-17 MED ORDER — MELOXICAM 7.5 MG PO TABS
ORAL_TABLET | ORAL | 2 refills | Status: DC
Start: 2021-01-17 — End: 2022-01-09

## 2021-02-03 DIAGNOSIS — Z1231 Encounter for screening mammogram for malignant neoplasm of breast: Secondary | ICD-10-CM | POA: Diagnosis not present

## 2021-02-03 LAB — HM MAMMOGRAPHY: HM Mammogram: NORMAL (ref 0–4)

## 2021-02-09 ENCOUNTER — Other Ambulatory Visit: Payer: Self-pay | Admitting: Family Medicine

## 2021-02-09 ENCOUNTER — Encounter: Payer: Self-pay | Admitting: Family Medicine

## 2021-02-25 ENCOUNTER — Telehealth: Payer: Self-pay

## 2021-02-25 NOTE — Progress Notes (Signed)
° ° °  Chronic Care Management Pharmacy Assistant   Name: Melanie Hamilton  MRN: [id number] DOB: [birth year]  Reason for Encounter: General disease state Patient Questions:  1.  Have you seen any other providers since your last visit?  12/02/21-puckering of macula  2.  Any changes in your medicines or health? No   PCP : Rochel Brome, MD  Allergies:  No Known Allergies  Medications: Outpatient Encounter Medications as of 02/25/2021  Medication Sig   aspirin 81 MG tablet Take 81 mg by mouth daily.   atorvastatin (LIPITOR) 20 MG tablet Take 1 tablet (20 mg total) by mouth at bedtime.   Biotin 5000 MCG CAPS Take 1 capsule by mouth in the morning and at bedtime.   calcium carbonate (OS-CAL) 600 MG TABS Take 600 mg by mouth in the morning and at bedtime.    calcium carbonate (TUMS - DOSED IN MG ELEMENTAL CALCIUM) 500 MG chewable tablet Chew 1 tablet by mouth daily as needed for indigestion or heartburn.   Chlorpheniramine Maleate (CHLOR-TABLETS PO) Take 1 tablet by mouth in the morning and at bedtime.   cholecalciferol (VITAMIN D) 1000 UNITS tablet Take 1,000 Units by mouth daily.   fish oil-omega-3 fatty acids 1000 MG capsule Take 1 g by mouth daily.    meloxicam (MOBIC) 7.5 MG tablet TAKE 1 TABLET BY MOUTH ONCE TO TWICE DAILY FOR SHOULDER PAIN   Multiple Vitamin (MULTIVITAMIN) capsule Take 1 capsule by mouth daily.   omeprazole (PRILOSEC) 40 MG capsule TAKE 1 CAPSULE EVERY DAY   raloxifene (EVISTA) 60 MG tablet TAKE 1 TABLET EVERY DAY   sulfamethoxazole-trimethoprim (BACTRIM DS) 800-160 MG tablet Take 1 tablet by mouth 2 (two) times daily. (Patient not taking: Reported on 11/29/2020)   No facility-administered encounter medications on file as of 02/25/2021.    Current Diagnosis: Patient Active Problem List   Diagnosis Date Noted   Cellulitis 08/11/2020   Right foot pain 06/09/2020   Mixed hyperlipidemia 06/09/2020   Osteoporosis 04/29/2020   Abnormal colonoscopy  08/14/2013   Spoke with patient she was very appreciative for [employer], CPP working on getting her Evista medication at no cost.    Patient stated she is doing well.  She has been working in the yard the past couple of days, she had a tree cut down and has been picking up brush from that.  Patient stated she is having an issue with her foot, it rolls in and becomes painful, she stated that she ordered some new shoes and inserts, if that doesn't work she was going to go to [facility] to get some help there.  Patient does not check her blood pressure at home, she does not follow any special diet.  Patient stated she is not having any difficulty with getting her medications from the pharmacy and has no side effects.   Follow-Up:  Pharmacist Review  Donette Larry, [place], [place] Pharmacist Assistant [phone]

## 2021-04-18 DIAGNOSIS — Z23 Encounter for immunization: Secondary | ICD-10-CM | POA: Diagnosis not present

## 2021-05-03 ENCOUNTER — Other Ambulatory Visit: Payer: Self-pay

## 2021-05-03 ENCOUNTER — Ambulatory Visit (INDEPENDENT_AMBULATORY_CARE_PROVIDER_SITE_OTHER): Payer: Medicare Other

## 2021-05-03 VITALS — BP 138/72 | HR 75 | Temp 98.0°F | Resp 16 | Ht 58.5 in | Wt 183.0 lb

## 2021-05-03 DIAGNOSIS — Z86010 Personal history of colonic polyps: Secondary | ICD-10-CM | POA: Insufficient documentation

## 2021-05-03 DIAGNOSIS — Z0000 Encounter for general adult medical examination without abnormal findings: Secondary | ICD-10-CM

## 2021-05-03 DIAGNOSIS — K2100 Gastro-esophageal reflux disease with esophagitis, without bleeding: Secondary | ICD-10-CM | POA: Insufficient documentation

## 2021-05-03 NOTE — Patient Instructions (Signed)
 Fall Prevention in the Home, Adult Falls can cause injuries and can happen to people of all ages. There are many things you can do to make your home safe and to help prevent falls. Ask for help when making these changes. What actions can I take to prevent falls? General Instructions  Use good lighting in all rooms. Replace any light bulbs that burn out.  Turn on the lights in dark areas. Use night-lights.  Keep items that you use often in easy-to-reach places. Lower the shelves around your home if needed.  Set up your furniture so you have a clear path. Avoid moving your furniture around.  Do not have throw rugs or other things on the floor that can make you trip.  Avoid walking on wet floors.  If any of your floors are uneven, fix them.  Add color or contrast paint or tape to clearly mark and help you see: ? Grab bars or handrails. ? First and last steps of staircases. ? Where the edge of each step is.  If you use a stepladder: ? Make sure that it is fully opened. Do not climb a closed stepladder. ? Make sure the sides of the stepladder are locked in place. ? Ask someone to hold the stepladder while you use it.  Know where your pets are when moving through your home. What can I do in the bathroom?  Keep the floor dry. Clean up any water on the floor right away.  Remove soap buildup in the tub or shower.  Use nonskid mats or decals on the floor of the tub or shower.  Attach bath mats securely with double-sided, nonslip rug tape.  If you need to sit down in the shower, use a plastic, nonslip stool.  Install grab bars by the toilet and in the tub and shower. Do not use towel bars as grab bars.      What can I do in the bedroom?  Make sure that you have a light by your bed that is easy to reach.  Do not use any sheets or blankets for your bed that hang to the floor.  Have a firm chair with side arms that you can use for support when you get dressed. What can I do  in the kitchen?  Clean up any spills right away.  If you need to reach something above you, use a step stool with a grab bar.  Keep electrical cords out of the way.  Do not use floor polish or wax that makes floors slippery. What can I do with my stairs?  Do not leave any items on the stairs.  Make sure that you have a light switch at the top and the bottom of the stairs.  Make sure that there are handrails on both sides of the stairs. Fix handrails that are broken or loose.  Install nonslip stair treads on all your stairs.  Avoid having throw rugs at the top or bottom of the stairs.  Choose a carpet that does not hide the edge of the steps on the stairs.  Check carpeting to make sure that it is firmly attached to the stairs. Fix carpet that is loose or worn. What can I do on the outside of my home?  Use bright outdoor lighting.  Fix the edges of walkways and driveways and fix any cracks.  Remove anything that might make you trip as you walk through a door, such as a raised step or threshold.  Trim   any bushes or trees on paths to your home.  Check to see if handrails are loose or broken and that both sides of all steps have handrails.  Install guardrails along the edges of any raised decks and porches.  Clear paths of anything that can make you trip, such as tools or rocks.  Have leaves, snow, or ice cleared regularly.  Use sand or salt on paths during winter.  Clean up any spills in your garage right away. This includes grease or oil spills. What other actions can I take?  Wear shoes that: ? Have a low heel. Do not wear high heels. ? Have rubber bottoms. ? Feel good on your feet and fit well. ? Are closed at the toe. Do not wear open-toe sandals.  Use tools that help you move around if needed. These include: ? Canes. ? Walkers. ? Scooters. ? Crutches.  Review your medicines with your doctor. Some medicines can make you feel dizzy. This can increase your  chance of falling. Ask your doctor what else you can do to help prevent falls. Where to find more information  Centers for Disease Control and Prevention, [employer]: www.cdc.gov  National Institute on Aging: www.nia.nih.gov Contact a doctor if:  You are afraid of falling at home.  You feel weak, drowsy, or dizzy at home.  You fall at home. Summary  There are many simple things that you can do to make your home safe and to help prevent falls.  Ways to make your home safe include removing things that can make you trip and installing grab bars in the bathroom.  Ask for help when making these changes in your home. This information is not intended to replace advice given to you by your health care provider. Make sure you discuss any questions you have with your health care provider. Document Revised: 07/14/2020 Document Reviewed: 07/14/2020 Elsevier Patient Education  2021 Elsevier Inc.   Health Maintenance, Female Adopting a healthy lifestyle and getting preventive care are important in promoting health and wellness. Ask your health care provider about:  The right schedule for you to have regular tests and exams.  Things you can do on your own to prevent diseases and keep yourself healthy. What should I know about diet, weight, and exercise? Eat a healthy diet  Eat a diet that includes plenty of vegetables, fruits, low-fat dairy products, and lean protein.  Do not eat a lot of foods that are high in solid fats, added sugars, or sodium.   Maintain a healthy weight Body mass index (BMI) is used to identify weight problems. It estimates body fat based on height and weight. Your health care provider can help determine your BMI and help you achieve or maintain a healthy weight. Get regular exercise Get regular exercise. This is one of the most important things you can do for your health. Most adults should:  Exercise for at least 150 minutes each week. The exercise should increase  your heart rate and make you sweat (moderate-intensity exercise).  Do strengthening exercises at least twice a week. This is in addition to the moderate-intensity exercise.  Spend less time sitting. Even light physical activity can be beneficial. Watch cholesterol and blood lipids Have your blood tested for lipids and cholesterol at 74 years of age, then have this test every 5 years. Have your cholesterol levels checked more often if:  Your lipid or cholesterol levels are high.  You are older than 74 years of age.  You are at   high risk for heart disease. What should I know about cancer screening? Depending on your health history and family history, you may need to have cancer screening at various ages. This may include screening for:  Breast cancer.  Cervical cancer.  Colorectal cancer.  Skin cancer.  Lung cancer. What should I know about heart disease, diabetes, and high blood pressure? Blood pressure and heart disease  High blood pressure causes heart disease and increases the risk of stroke. This is more likely to develop in people who have high blood pressure readings, are of African descent, or are overweight.  Have your blood pressure checked: ? Every 3-5 years if you are 18-39 years of age. ? Every year if you are 40 years old or older. Diabetes Have regular diabetes screenings. This checks your fasting blood sugar level. Have the screening done:  Once every three years after age 40 if you are at a normal weight and have a low risk for diabetes.  More often and at a younger age if you are overweight or have a high risk for diabetes. What should I know about preventing infection? Hepatitis B If you have a higher risk for hepatitis B, you should be screened for this virus. Talk with your health care provider to find out if you are at risk for hepatitis B infection. Hepatitis C Testing is recommended for:  Everyone born from 1945 through 1965.  Anyone with known  risk factors for hepatitis C. Sexually transmitted infections (STIs)  Get screened for STIs, including gonorrhea and chlamydia, if: ? You are sexually active and are younger than 74 years of age. ? You are older than 74 years of age and your health care provider tells you that you are at risk for this type of infection. ? Your sexual activity has changed since you were last screened, and you are at increased risk for chlamydia or gonorrhea. Ask your health care provider if you are at risk.  Ask your health care provider about whether you are at high risk for HIV. Your health care provider may recommend a prescription medicine to help prevent HIV infection. If you choose to take medicine to prevent HIV, you should first get tested for HIV. You should then be tested every 3 months for as long as you are taking the medicine. Pregnancy  If you are about to stop having your period (premenopausal) and you may become pregnant, seek counseling before you get pregnant.  Take 400 to 800 micrograms (mcg) of folic acid every day if you become pregnant.  Ask for birth control (contraception) if you want to prevent pregnancy. Osteoporosis and menopause Osteoporosis is a disease in which the bones lose minerals and strength with aging. This can result in bone fractures. If you are 65 years old or older, or if you are at risk for osteoporosis and fractures, ask your health care provider if you should:  Be screened for bone loss.  Take a calcium or vitamin D supplement to lower your risk of fractures.  Be given hormone replacement therapy (HRT) to treat symptoms of menopause. Follow these instructions at home: Lifestyle  Do not use any products that contain nicotine or tobacco, such as cigarettes, e-cigarettes, and chewing tobacco. If you need help quitting, ask your health care provider.  Do not use street drugs.  Do not share needles.  Ask your health care provider for help if you need support or  information about quitting drugs. Alcohol use  Do not drink alcohol   if: ? Your health care provider tells you not to drink. ? You are pregnant, may be pregnant, or are planning to become pregnant.  If you drink alcohol: ? Limit how much you use to 0-1 drink a day. ? Limit intake if you are breastfeeding.  Be aware of how much alcohol is in your drink. In the [place] one drink equals one 12 oz bottle of beer (355 mL), one 5 oz glass of wine (148 mL), or one 1 oz glass of hard liquor (44 mL). General instructions  Schedule regular health, dental, and eye exams.  Stay current with your vaccines.  Tell your health care provider if: ? You often feel depressed. ? You have ever been abused or do not feel safe at home. Summary  Adopting a healthy lifestyle and getting preventive care are important in promoting health and wellness.  Follow your health care provider's instructions about healthy diet, exercising, and getting tested or screened for diseases.  Follow your health care provider's instructions on monitoring your cholesterol and blood pressure. This information is not intended to replace advice given to you by your health care provider. Make sure you discuss any questions you have with your health care provider. Document Revised: 12/04/2018 Document Reviewed: 12/04/2018 Elsevier Patient Education  2021 Elsevier Inc.  

## 2021-05-03 NOTE — Progress Notes (Signed)
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 female who presents for Medicare Annual (Subsequent) preventive examination.  This wellness visit is conducted by a nurse.  The patient's medications were reviewed and reconciled since the patient's last visit.  History details were provided by the patient.  The history appears to be reliable.    Patient's last AWV was one year ago.   Medical History: Patient history and Family history was reviewed  Medications, Allergies, and preventative health maintenance was reviewed and updated.   Review of Systems    Review of Systems  Constitutional: Negative.   HENT: Negative.   Eyes: Positive for visual disturbance.       Floaters in left eye  Respiratory: Negative.  Negative for cough, chest tightness and shortness of breath.   Cardiovascular: Negative.  Negative for chest pain and palpitations.  Gastrointestinal: Negative.   Genitourinary: Negative.   Musculoskeletal: Negative.   Neurological: Negative.  Negative for dizziness, weakness, numbness and headaches.  Psychiatric/Behavioral: Negative.  Negative for behavioral problems, confusion, decreased concentration and suicidal ideas.   Cardiac Risk Factors include: advanced age (>70men, >51 women);dyslipidemia;obesity (BMI >30kg/m2)     Objective:    Today's Vitals   05/03/21 0952  BP: 138/72  Pulse: 75  Resp: 16  Temp: 98 F (36.7 C)  SpO2: 96%  Weight: 183 lb (83 kg)  Height: 4' 10.5" (1.486 m)  PainSc: 0-No pain   Body mass index is 37.6 kg/m.  Advanced Directives 05/03/2021 04/29/2020  Does Patient Have a Medical Advance Directive? Yes Yes  Type of Paramedic of [place];Living will [place];Living will  Does patient want to make changes to medical advance directive? No - Patient declined No - Patient declined  Copy of [place] in Chart? No - copy requested No - copy requested    Current Medications  (verified) Outpatient Encounter Medications as of 05/03/2021  Medication Sig  . aspirin 81 MG tablet Take 81 mg by mouth daily.  [name] atorvastatin (LIPITOR) 20 MG tablet Take 1 tablet (20 mg total) by mouth at bedtime.  . Biotin 5000 MCG CAPS Take 1 capsule by mouth in the morning and at bedtime.  . calcium carbonate (OS-CAL) 600 MG TABS Take 600 mg by mouth in the morning and at bedtime.   . calcium carbonate (TUMS - DOSED IN MG ELEMENTAL CALCIUM) 500 MG chewable tablet Chew 1 tablet by mouth daily as needed for indigestion or heartburn.  . Chlorpheniramine Maleate (CHLOR-TABLETS PO) Take 1 tablet by mouth in the morning and at bedtime.  . cholecalciferol (VITAMIN D) 1000 UNITS tablet Take 1,000 Units by mouth daily.  . fish oil-omega-3 fatty acids 1000 MG capsule Take 1 g by mouth daily.   . meloxicam (MOBIC) 7.5 MG tablet TAKE 1 TABLET BY MOUTH ONCE TO TWICE DAILY FOR SHOULDER PAIN  . Multiple Vitamin (MULTIVITAMIN) capsule Take 1 capsule by mouth daily.  [name] omeprazole (PRILOSEC) 40 MG capsule TAKE 1 CAPSULE EVERY DAY  . raloxifene (EVISTA) 60 MG tablet TAKE 1 TABLET EVERY DAY  . [DISCONTINUED] sulfamethoxazole-trimethoprim (BACTRIM DS) 800-160 MG tablet Take 1 tablet by mouth 2 (two) times daily. (Patient not taking: Reported on 05/03/2021)   No facility-administered encounter medications on file as of 05/03/2021.   Allergies (verified) Patient has no known allergies.   History: Past Medical History:  Diagnosis Date  . Cancer [facility]) 1989   breast cancer/ chemo therapy/ mastectomy and reconstruction  .  Chemotherapy adverse reaction    breast cancer-1989  . Diverticulosis 2007  . GERD (gastroesophageal reflux disease)   . Hiatal hernia   . HX: breast cancer 1989  . Hyperplastic colon polyp 11/2001  . Mixed hyperlipidemia   . Osteoarthritis   . Shingles 5/20   Past Surgical History:  Procedure Laterality Date  . BREAST SURGERY  1989   reconstruction   . bunion removed  01/2005  .  carpel tunnel release  1994  . COLONOSCOPY  2007  . KNEE SURGERY Right   . REPLACEMENT TOTAL KNEE Right 11/2008  . ROTATOR CUFF REPAIR  08/2008   Family History  Problem Relation Age of Onset  . Cancer Brother        colon cancer  . Osteoarthritis Mother   . Hypertension Mother   . Heart failure Father   . Cancer Maternal Grandfather   . Heart failure Paternal Grandmother    Social History   Socioeconomic History  . Marital status: Widowed    Spouse name: Not on file  . Number of children: 2  . Years of education: Not on file  . Highest education level: Not on file  Occupational History  . Not on file  Tobacco Use  . Smoking status: Never Smoker  . Smokeless tobacco: Never Used  Vaping Use  . Vaping Use: Never used  Substance and Sexual Activity  . Alcohol use: No  . Drug use: No  . Sexual activity: Not Currently    Birth control/protection: Post-menopausal  Other Topics Concern  . Not on file  Social History Narrative   Patient lives near family and has close relationship   Social Determinants of Health   Financial Resource Strain: Not on file  Food Insecurity: No Food Insecurity  . Worried About Charity fundraiser in the Last Year: Never true  . Ran Out of Food in the Last Year: Never true  Transportation Needs: No Transportation Needs  . Lack of Transportation (Medical): No  . Lack of Transportation (Non-Medical): No  Physical Activity: Not on file  Stress: Not on file  Social Connections: Not on file   Tobacco Counseling Counseling given: never smoker  Clinical Intake: Pre-visit preparation completed: Yes  Pain : No/denies pain Pain Score: 0-No pain   BMI - recorded: 37.6 Nutritional Status: BMI > 30  Obese Nutritional Risks: None Diabetes: No  How often do you need to have someone help you when you read instructions, pamphlets, or other written materials from your doctor or pharmacy?: 2 - Rarely Interpreter Needed?: No    Activities of  Daily Living In your present state of health, do you have any difficulty performing the following activities: 05/03/2021  Hearing? N  Vision? Y  Comment floater in left eye, cataracts  Difficulty concentrating or making decisions? N  Walking or climbing stairs? Y  Comment knee pain, hip or back pain  Dressing or bathing? N  Doing errands, shopping? N  Preparing Food and eating ? N  Using the Toilet? N  In the past six months, have you accidently leaked urine? N  Do you have problems with loss of bowel control? N  Managing your Medications? N  Managing your Finances? N  Housekeeping or managing your Housekeeping? N  Some recent data might be hidden    Patient Care Team: Rochel Brome, MD as PCP - General (Family Medicine) Lynnell Dike, [place] (Optometry) Teena Irani, MD (Inactive) (Gastroenterology) Burnice Logan, [facility] as Pharmacist (Pharmacist)  Assessment:   This is a routine wellness examination for [place].  Dietary issues and exercise activities discussed: Current Exercise Habits: The patient does not participate in regular exercise at present however she is active at home working in the yard and walking outside. Exercise limited by: None identified  Goals Addressed            This Visit's Progress   . Have 3 meals a day       Eat three balanced meals daily, trying not to eat after 7 pm.      Depression Screen PHQ 2/9 Scores 05/03/2021 11/29/2020 04/29/2020  PHQ - 2 Score 0 0 0    Fall Risk Fall Risk  05/03/2021 04/29/2020 11/19/2019 08/23/2016  Falls in the past year? 0 0 0 No  Comment - - [facility] Telephone Survey: data to providers prior to load [employer] Telephone Survey: data to providers prior to load  Number falls in past yr: 0 0 - -  Injury with Fall? 0 0 - -  Risk for fall due to : No Fall Risks No Fall Risks - -  Follow up Falls evaluation completed;Falls prevention discussed Falls evaluation completed;Falls prevention discussed - -    FALL RISK PREVENTION  PERTAINING TO THE HOME:  Any stairs in or around the home? Yes  If so, are there any without handrails? No  Home free of loose throw rugs in walkways, pet beds, electrical cords, etc? Yes  Adequate lighting in your home to reduce risk of falls? Yes   ASSISTIVE DEVICES UTILIZED TO PREVENT FALLS:  Life alert? No  Use of a cane, walker or w/c? No  Grab bars in the bathroom? No  Shower chair or bench in shower? No  Elevated toilet seat or a handicapped toilet? No  Gait steady and fast without use of assistive device  Cognitive Function:     6CIT Screen 05/03/2021 04/29/2020  What Year? 0 points 0 points  What month? 0 points 0 points  What time? 0 points 0 points  Count back from 20 0 points 0 points  Months in reverse 0 points 0 points  Repeat phrase 0 points 0 points  Total Score 0 0    Immunizations Immunization History  Administered Date(s) Administered  . DTaP 12/25/2008  . Influenza, High Dose Seasonal PF 10/16/2018  . Influenza-Unspecified 12/25/2018, 10/12/2020  . Moderna Sars-Covid-2 Vaccination 04/18/2021  . PFIZER(Purple Top)SARS-COV-2 Vaccination 01/10/2020, 01/31/2020, 09/23/2020  . Pneumococcal Conjugate-13 02/26/2015  . Pneumococcal Polysaccharide-23 11/09/2017  . Pneumococcal-Unspecified 12/25/2013  . Td 02/15/2010  . Tdap 08/13/2020  . Zoster 10/21/2014  . Zoster Recombinat (Shingrix) 09/24/2017, 12/08/2017    TDAP status: Up to date  Flu Vaccine status: Up to date  Pneumococcal vaccine status: Up to date  Covid-19 vaccine status: Completed vaccines   Screening Tests Health Maintenance  Topic Date Due  . Hepatitis C Screening  Never done  . INFLUENZA VACCINE  07/25/2021  . MAMMOGRAM  02/03/2022  . COLONOSCOPY (Pts 45-27yrs Insurance coverage will need to be confirmed)  09/22/2025  . TETANUS/TDAP  08/13/2030  . DEXA SCAN  Completed  . COVID-19 Vaccine  Completed  . PNA vac Low Risk Adult  Completed  . HPV VACCINES  Aged Out    Health  Maintenance  Health Maintenance Due  Topic Date Due  . Hepatitis C Screening  Never done   Colorectal cancer screening: Type of screening: Colonoscopy. Completed 08/2020. Repeat every 10 years  Mammogram status: Completed 02/03/21. Repeat every year  Bone Density status: Completed 02/16/20. Results reflect: Bone density results: OSTEOPOROSIS. Repeat every 2 years.  Lung Cancer Screening: (Low Dose CT Chest recommended if Age 55-80 years, 30 pack-year currently smoking OR have quit w/in 15years.) does not qualify.   Additional Screening:  Vision Screening: Recommended annual ophthalmology exams for early detection of glaucoma and other disorders of the eye. Is the patient up to date with their annual eye exam?  Yes  Who is the provider or what is the name of the office in which the patient attends annual eye exams? Dr Renaldo Fiddler, [place] - patient has annual appointment in December  Dental Screening: Recommended annual dental exams for proper oral hygiene    Plan:     1- Patient will be due for DEXA scan and will schedule with her mammogram in Feb 2023 2- Advance Directive - patient will bring a copy for our file 3- Diet and Exercise - discussed eating three balanced meals daily and eating the last meal before 7 pm.  She would like to loose a few pounds.  She will increase her activity as tolerated making it a point to walk more.  She drinks water mostly and no soft drinks.   I have personally reviewed and noted the following in the patient's chart:   . Medical and social history . Use of alcohol, tobacco or illicit drugs  . Current medications and supplements including opioid prescriptions.  . Functional ability and status . Nutritional status . Physical activity . Advanced directives . List of other physicians . Hospitalizations, surgeries, and ER visits in previous 12 months . Vitals . Screenings to include cognitive, depression, and falls . Referrals and appointments  In  addition, I have reviewed and discussed with patient certain preventive protocols, quality metrics, and best practice recommendations. A written personalized care plan for preventive services as well as general preventive health recommendations were provided to patient.     Erie Noe, LPN   [phone]

## 2021-06-03 ENCOUNTER — Telehealth: Payer: Self-pay

## 2021-06-03 NOTE — Progress Notes (Signed)
    Chronic Care Management Pharmacy Assistant   Name: Melanie Hamilton  MRN: [id number] DOB: 1946/05/06  Melanie Hamilton is an 75 y.o. year old female who presents for her follow-up CCM visit with the clinical pharmacist.  Recent office visits:  05/03/2021: Rhae Hammock, LPN (PCP) / annual wellness exam / completed course of Sulfa/ no other medication changes noted  Recent consult visits:  None noted since last CCM visit   Hospital visits:  None noted since last CCM visit   Medications: Outpatient Encounter Medications as of 06/03/2021  Medication Sig   aspirin 81 MG tablet Take 81 mg by mouth daily.   atorvastatin (LIPITOR) 20 MG tablet Take 1 tablet (20 mg total) by mouth at bedtime.   Biotin 5000 MCG CAPS Take 1 capsule by mouth in the morning and at bedtime.   calcium carbonate (OS-CAL) 600 MG TABS Take 600 mg by mouth in the morning and at bedtime.    calcium carbonate (TUMS - DOSED IN MG ELEMENTAL CALCIUM) 500 MG chewable tablet Chew 1 tablet by mouth daily as needed for indigestion or heartburn.   Chlorpheniramine Maleate (CHLOR-TABLETS PO) Take 1 tablet by mouth in the morning and at bedtime.   cholecalciferol (VITAMIN D) 1000 UNITS tablet Take 1,000 Units by mouth daily.   fish oil-omega-3 fatty acids 1000 MG capsule Take 1 g by mouth daily.    meloxicam (MOBIC) 7.5 MG tablet TAKE 1 TABLET BY MOUTH ONCE TO TWICE DAILY FOR SHOULDER PAIN   Multiple Vitamin (MULTIVITAMIN) capsule Take 1 capsule by mouth daily.   omeprazole (PRILOSEC) 40 MG capsule TAKE 1 CAPSULE EVERY DAY   raloxifene (EVISTA) 60 MG tablet TAKE 1 TABLET EVERY DAY   No facility-administered encounter medications on file as of 06/03/2021.  Contacted Melanie Hamilton for general disease state and medication adherence call.     Patient is not > 5 days past due for refill on the following medications per chart history:    [facility] Medications: Medication Name/mg Last Fill Days Supply Atorvastatin 20mg .    06/14/2021 90DS   [place] for last fill date   What concerns do you have about your medications? No  The patient denies side effects with her medications.   How often do you forget or accidentally miss a dose? Never   Are you having any problems getting your medications from your pharmacy? Patient stated that she has no problems getting medications.  Has the cost of your medications been a concern? No, patient stated Melanie Hamilton has been cheaper than Product/process development scientist.   Since last visit with CPP, no interventions have been made:   The patient has not had an ED visit since last contact.   The patient denies problems with their health.   she denies  concerns or questions for Sherre Poot, at this time.   Does not check BP at home   Counseled patient on:   [place] job taking medications!    Care Gaps: Last annual wellness visit? 05/03/2021 w/ Rhae Hammock, [place], [place] Clinical Pharmacist Assistant

## 2021-06-14 ENCOUNTER — Other Ambulatory Visit: Payer: Self-pay | Admitting: Family Medicine

## 2021-06-14 DIAGNOSIS — U071 COVID-19: Secondary | ICD-10-CM | POA: Diagnosis not present

## 2021-06-15 ENCOUNTER — Ambulatory Visit: Payer: Medicare Other | Admitting: Family Medicine

## 2021-06-20 ENCOUNTER — Telehealth: Payer: Medicare Other

## 2021-06-29 ENCOUNTER — Ambulatory Visit: Payer: Medicare Other | Admitting: Family Medicine

## 2021-07-24 DIAGNOSIS — U071 COVID-19: Secondary | ICD-10-CM | POA: Diagnosis not present

## 2021-07-26 ENCOUNTER — Other Ambulatory Visit: Payer: Self-pay

## 2021-07-26 ENCOUNTER — Encounter: Payer: Self-pay | Admitting: Family Medicine

## 2021-07-26 ENCOUNTER — Ambulatory Visit (INDEPENDENT_AMBULATORY_CARE_PROVIDER_SITE_OTHER): Payer: Medicare Other | Admitting: Family Medicine

## 2021-07-26 VITALS — BP 142/78 | HR 80 | Temp 97.2°F | Resp 16 | Ht 59.0 in | Wt 183.4 lb

## 2021-07-26 DIAGNOSIS — K219 Gastro-esophageal reflux disease without esophagitis: Secondary | ICD-10-CM | POA: Diagnosis not present

## 2021-07-26 DIAGNOSIS — E782 Mixed hyperlipidemia: Secondary | ICD-10-CM

## 2021-07-26 DIAGNOSIS — Z6837 Body mass index (BMI) 37.0-37.9, adult: Secondary | ICD-10-CM

## 2021-07-26 DIAGNOSIS — M8588 Other specified disorders of bone density and structure, other site: Secondary | ICD-10-CM

## 2021-07-26 DIAGNOSIS — R011 Cardiac murmur, unspecified: Secondary | ICD-10-CM | POA: Diagnosis not present

## 2021-07-26 DIAGNOSIS — Z853 Personal history of malignant neoplasm of breast: Secondary | ICD-10-CM

## 2021-07-26 NOTE — Progress Notes (Signed)
Subjective:  Patient ID: Melanie Hamilton, female    DOB: 1946/06/29  Age: 75 y.o. MRN: [id number]  Chief Complaint  Patient presents with   Hyperlipidemia   Gastroesophageal Reflux    HPI Patient is a 76 year old white female who presents for follow-up of hyperlipidemia, GERD.  Denies any significant problems.  Patient eats healthy.  Patient exercises.  She mows her lawn regularly.  Currently on Lipitor 20 mg once daily at night and aspirin 81 mg once daily. Osteoporosis patient on calcium carbonate, and Evista 60 mg once daily.  Patient has GERD and is currently taking omeprazole.  Current Outpatient Medications on File Prior to Visit  Medication Sig Dispense Refill   aspirin 81 MG tablet Take 81 mg by mouth daily.     atorvastatin (LIPITOR) 20 MG tablet TAKE 1 TABLET (20 MG TOTAL) BY MOUTH AT BEDTIME. 90 tablet 0   Biotin 5000 MCG CAPS Take 1 capsule by mouth in the morning and at bedtime.     calcium carbonate (OS-CAL) 600 MG TABS Take 600 mg by mouth in the morning and at bedtime.      calcium carbonate (TUMS - DOSED IN MG ELEMENTAL CALCIUM) 500 MG chewable tablet Chew 1 tablet by mouth daily as needed for indigestion or heartburn.     Chlorpheniramine Maleate (CHLOR-TABLETS PO) Take 1 tablet by mouth in the morning and at bedtime.     cholecalciferol (VITAMIN D) 1000 UNITS tablet Take 1,000 Units by mouth daily.     fish oil-omega-3 fatty acids 1000 MG capsule Take 1 g by mouth daily.      meloxicam (MOBIC) 7.5 MG tablet TAKE 1 TABLET BY MOUTH ONCE TO TWICE DAILY FOR SHOULDER PAIN 180 tablet 2   Multiple Vitamin (MULTIVITAMIN) capsule Take 1 capsule by mouth daily.     omeprazole (PRILOSEC) 40 MG capsule TAKE 1 CAPSULE EVERY DAY 90 capsule 3   raloxifene (EVISTA) 60 MG tablet TAKE 1 TABLET EVERY DAY 90 tablet 1   No current facility-administered medications on file prior to visit.   Past Medical History:  Diagnosis Date   Cancer [facility]) 1989   breast cancer/ chemo therapy/  mastectomy and reconstruction   Chemotherapy adverse reaction    breast cancer-1989   Diverticulosis 2007   GERD (gastroesophageal reflux disease)    Hiatal hernia    HX: breast cancer 1989   Hyperplastic colon polyp 11/2001   Mixed hyperlipidemia    Osteoarthritis    Shingles 5/20   Past Surgical History:  Procedure Laterality Date   BREAST SURGERY  1989   reconstruction    bunion removed  01/2005   carpel tunnel release  1994   COLONOSCOPY  2007   KNEE SURGERY Right    REPLACEMENT TOTAL KNEE Right 11/2008   ROTATOR CUFF REPAIR  08/2008    Family History  Problem Relation Age of Onset   Osteoarthritis Mother    Hypertension Mother    Heart failure Father    Cancer Brother        colon cancer   Cirrhosis Brother    Diabetes Brother    Heart attack Brother    Cancer Maternal Grandfather    Heart failure Paternal Grandmother    Social History   Socioeconomic History   Marital status: Widowed    Spouse name: Not on file   Number of children: 2   Years of education: Not on file   Highest education level: Not on file  Occupational History  Not on file  Tobacco Use   Smoking status: Never   Smokeless tobacco: Never  Vaping Use   Vaping Use: Never used  Substance and Sexual Activity   Alcohol use: No   Drug use: No   Sexual activity: Not Currently    Birth control/protection: Post-menopausal  Other Topics Concern   Not on file  Social History Narrative   Patient lives near family and has close relationship   Social Determinants of Health   Financial Resource Strain: Not on file  Food Insecurity: No Food Insecurity   Worried About Charity fundraiser in the Last Year: Never true   [place] in the Last Year: Never true  Transportation Needs: No Transportation Needs   Lack of Transportation (Medical): No   Lack of Transportation (Non-Medical): No  Physical Activity: Not on file  Stress: Not on file  Social Connections: Not on file    Review of  Systems  Constitutional:  Negative for chills, fatigue and fever.  HENT:  Negative for congestion, rhinorrhea and sore throat.   Respiratory:  Negative for cough and shortness of breath.   Cardiovascular:  Negative for chest pain.  Gastrointestinal:  Negative for abdominal pain, constipation, diarrhea, nausea and vomiting.  Genitourinary:  Negative for dysuria and urgency.       Stress incontinence. Wears a small pad.   Musculoskeletal:  Positive for back pain. Negative for myalgias.  Neurological:  Negative for dizziness, weakness, light-headedness and headaches.  Psychiatric/Behavioral:  Negative for dysphoric mood. The patient is not nervous/anxious.     Objective:  BP (!) 142/78   Pulse 80   Temp (!) 97.2 F (36.2 C)   Resp 16   Ht '4\' 11"'$  (1.499 m)   Wt 183 lb 6.4 oz (83.2 kg)   LMP 12/26/1987   BMI 37.04 kg/m   BP/Weight 07/26/2021 05/03/2021 [id number]  Systolic BP A999333 0000000 A999333  Diastolic BP 78 72 62  Wt. (Lbs) 183.4 183 183  BMI 37.04 37.6 34.58    Physical Exam Vitals reviewed.  Constitutional:      Appearance: Normal appearance. She is normal weight.  Neck:     Vascular: No carotid bruit.  Cardiovascular:     Rate and Rhythm: Normal rate and regular rhythm.     Heart sounds: Murmur heard.  Pulmonary:     Effort: Pulmonary effort is normal. No respiratory distress.     Breath sounds: Normal breath sounds.  Abdominal:     General: Abdomen is flat. Bowel sounds are normal.     Palpations: Abdomen is soft.     Tenderness: There is no abdominal tenderness.  Neurological:     Mental Status: She is alert and oriented to person, place, and time.  Psychiatric:        Mood and Affect: Mood normal.        Behavior: Behavior normal.    Diabetic Foot Exam - Simple   No data filed      Lab Results  Component Value Date   WBC 3.8 06/09/2020   HGB 13.8 06/09/2020   HCT 39.0 06/09/2020   PLT 212 06/09/2020   GLUCOSE 87 06/09/2020   CHOL 165 06/09/2020   TRIG 65  06/09/2020   HDL 74 06/09/2020   LDLCALC 78 06/09/2020   ALT 18 06/09/2020   AST 25 06/09/2020   NA 141 06/09/2020   K 4.5 06/09/2020   CL 103 06/09/2020   CREATININE 0.86 06/09/2020   BUN  19 06/09/2020   CO2 25 06/09/2020   INR 1.0 12/17/2008      Assessment & Plan:   1. Mixed hyperlipidemia Well controlled.  No changes to medicines.  Continue to work on eating a healthy diet and exercise.  Labs drawn today.  - CBC with Differential/Platelet - Comprehensive metabolic panel - Lipid panel - TSH  2. Heart murmur - ECHOCARDIOGRAM COMPLETE  3. Class 2 severe obesity due to excess calories with serious comorbidity and body mass index (BMI) of 37.0 to 37.9 in adult [facility])  Recommend continue to work on eating healthy diet and exercise.  4.  Gastroesophageal reflux disease: Continue omeprazole.  5.  Osteopenia: Continue calcium carbonate.  Currently also on Evista  6.  Personal history of breast cancer many years ago.  Cured.  Orders Placed This Encounter  Procedures   CBC with Differential/Platelet   Comprehensive metabolic panel   Lipid panel   TSH   ECHOCARDIOGRAM COMPLETE     Follow-up: Return in about 1 year (around 07/26/2022) for fasting.  An After Visit Summary was printed and given to the patient.  Rochel Brome, MD [facility] [phone]

## 2021-07-27 LAB — CBC WITH DIFFERENTIAL/PLATELET
Basophils Absolute: 0 10*3/uL (ref 0.0–0.2)
Basos: 1 %
EOS (ABSOLUTE): 0.2 10*3/uL (ref 0.0–0.4)
Eos: 4 %
Hematocrit: 39.7 % (ref 34.0–46.6)
Hemoglobin: 13.7 g/dL (ref 11.1–15.9)
Immature Grans (Abs): 0 10*3/uL (ref 0.0–0.1)
Immature Granulocytes: 0 %
Lymphocytes Absolute: 1.5 10*3/uL (ref 0.7–3.1)
Lymphs: 31 %
MCH: 32.5 pg (ref 26.6–33.0)
MCHC: 34.5 g/dL (ref 31.5–35.7)
MCV: 94 fL (ref 79–97)
Monocytes Absolute: 0.5 10*3/uL (ref 0.1–0.9)
Monocytes: 11 %
Neutrophils Absolute: 2.5 10*3/uL (ref 1.4–7.0)
Neutrophils: 53 %
Platelets: 199 10*3/uL (ref 150–450)
RBC: 4.21 x10E6/uL (ref 3.77–5.28)
RDW: 12.9 % (ref 11.7–15.4)
WBC: 4.7 10*3/uL (ref 3.4–10.8)

## 2021-07-27 LAB — COMPREHENSIVE METABOLIC PANEL
ALT: 20 IU/L (ref 0–32)
AST: 27 IU/L (ref 0–40)
Albumin/Globulin Ratio: 2.8 — ABNORMAL HIGH (ref 1.2–2.2)
Albumin: 4.4 g/dL (ref 3.7–4.7)
Alkaline Phosphatase: 95 IU/L (ref 44–121)
BUN/Creatinine Ratio: 24 (ref 12–28)
BUN: 20 mg/dL (ref 8–27)
Bilirubin Total: 0.7 mg/dL (ref 0.0–1.2)
CO2: 24 mmol/L (ref 20–29)
Calcium: 10.1 mg/dL (ref 8.7–10.3)
Chloride: 102 mmol/L (ref 96–106)
Creatinine, Ser: 0.82 mg/dL (ref 0.57–1.00)
Globulin, Total: 1.6 g/dL (ref 1.5–4.5)
Glucose: 84 mg/dL (ref 65–99)
Potassium: 4.6 mmol/L (ref 3.5–5.2)
Sodium: 142 mmol/L (ref 134–144)
Total Protein: 6 g/dL (ref 6.0–8.5)
eGFR: 75 mL/min/{1.73_m2} (ref >59)

## 2021-07-27 LAB — LIPID PANEL
Chol/HDL Ratio: 2.1 ratio (ref 0.0–4.4)
Cholesterol, Total: 157 mg/dL (ref 100–199)
HDL: 75 mg/dL (ref >39)
LDL Chol Calc (NIH): 70 mg/dL (ref 0–99)
Triglycerides: 59 mg/dL (ref 0–149)
VLDL Cholesterol Cal: 12 mg/dL (ref 5–40)

## 2021-07-27 LAB — TSH: TSH: 3.03 u[IU]/mL (ref 0.450–4.500)

## 2021-07-27 LAB — CARDIOVASCULAR RISK ASSESSMENT

## 2021-08-31 DIAGNOSIS — Z23 Encounter for immunization: Secondary | ICD-10-CM | POA: Diagnosis not present

## 2021-10-07 ENCOUNTER — Telehealth: Payer: Self-pay

## 2021-10-07 NOTE — Telephone Encounter (Signed)
Created notification to renew PAP in December for patient, will f/u then

## 2021-10-07 NOTE — Chronic Care Management (AMB) (Signed)
Chronic Care Management Pharmacy Assistant   Name: Melanie Hamilton  MRN: [id number] DOB: 1946-03-01   Reason for Encounter: Disease State call for lipids    Recent office visits:  07/26/21 Rochel Brome MD. Seen for hyperlipidemia.  No med changes. Follow up in 1 year.  Recent consult visits:  None since 06/03/21  Hospital visits:  None since 06/03/21  Medications: Outpatient Encounter Medications as of 10/07/2021  Medication Sig   aspirin 81 MG tablet Take 81 mg by mouth daily.   atorvastatin (LIPITOR) 20 MG tablet TAKE 1 TABLET (20 MG TOTAL) BY MOUTH AT BEDTIME.   Biotin 5000 MCG CAPS Take 1 capsule by mouth in the morning and at bedtime.   calcium carbonate (OS-CAL) 600 MG TABS Take 600 mg by mouth in the morning and at bedtime.    calcium carbonate (TUMS - DOSED IN MG ELEMENTAL CALCIUM) 500 MG chewable tablet Chew 1 tablet by mouth daily as needed for indigestion or heartburn.   Chlorpheniramine Maleate (CHLOR-TABLETS PO) Take 1 tablet by mouth in the morning and at bedtime.   cholecalciferol (VITAMIN D) 1000 UNITS tablet Take 1,000 Units by mouth daily.   fish oil-omega-3 fatty acids 1000 MG capsule Take 1 g by mouth daily.    meloxicam (MOBIC) 7.5 MG tablet TAKE 1 TABLET BY MOUTH ONCE TO TWICE DAILY FOR SHOULDER PAIN   Multiple Vitamin (MULTIVITAMIN) capsule Take 1 capsule by mouth daily.   omeprazole (PRILOSEC) 40 MG capsule TAKE 1 CAPSULE EVERY DAY   raloxifene (EVISTA) 60 MG tablet TAKE 1 TABLET EVERY DAY   No facility-administered encounter medications on file as of 10/07/2021.    Lipid Panel    Component Value Date/Time   CHOL 157 07/26/2021 0756   TRIG 59 07/26/2021 0756   HDL 75 07/26/2021 0756   LDLCALC 70 07/26/2021 0756    10-year ASCVD risk score: The 10-year ASCVD risk score (Arnett DK, et al., 2019) is: 14.8%   Values used to calculate the score:     Age: 75 years     Sex: Female     Is Non-Hispanic African American: No     Diabetic: No     Tobacco  smoker: No     Systolic Blood Pressure: 625 mmHg mmHg     Is BP treated: No     HDL Cholesterol: 75 mg/dL     Total Cholesterol: 157 mg/dL  Current antihyperlipidemic regimen:  Atorvastatin 20 mg daily at bedtime  Fish Oil 1000 mg daily   ASCVD risk enhancing conditions: age >74  What recent interventions/DTPs have been made by any provider to improve Cholesterol control since last CPP Visit: No other changes   Any recent hospitalizations or ED visits since last visit with CPP? No recent visit   What diet changes have been made to improve Cholesterol?  Typical Breakfast is Oatmeal or peanut butter toast   What exercise is being done to improve Cholesterol?  Pt she is getting exercise daily   Pt wants to know if we are going to renew her Evista through her mail order. She stated Emeterio Reeve got it at no cost for her. She stated she received a new letter saying its approved through the end of this year. Pt wants to know about next year. Do we need to call them again?  Adherence Review: Does the patient have >5 day gap between last estimated fill dates? Yes, but pt reported that she just got a new shipment in the  mail  [facility] Rating Drugs Medication Name Last Fill Days supply Atorvastatin   06/15/21 90ds   Care Gaps: Last annual wellness visit? Done on 05/03/21   Elray Mcgregor, [place] Clinical Pharmacist Assistant  [phone]

## 2021-10-29 DIAGNOSIS — Z23 Encounter for immunization: Secondary | ICD-10-CM | POA: Diagnosis not present

## 2021-11-11 ENCOUNTER — Other Ambulatory Visit: Payer: Self-pay | Admitting: Physician Assistant

## 2021-11-30 ENCOUNTER — Other Ambulatory Visit: Payer: Self-pay | Admitting: Family Medicine

## 2021-12-27 DIAGNOSIS — H2513 Age-related nuclear cataract, bilateral: Secondary | ICD-10-CM | POA: Diagnosis not present

## 2021-12-27 DIAGNOSIS — H25013 Cortical age-related cataract, bilateral: Secondary | ICD-10-CM | POA: Diagnosis not present

## 2021-12-27 DIAGNOSIS — H35372 Puckering of macula, left eye: Secondary | ICD-10-CM | POA: Diagnosis not present

## 2021-12-27 DIAGNOSIS — H18413 Arcus senilis, bilateral: Secondary | ICD-10-CM | POA: Diagnosis not present

## 2021-12-27 DIAGNOSIS — H524 Presbyopia: Secondary | ICD-10-CM | POA: Diagnosis not present

## 2022-01-08 ENCOUNTER — Other Ambulatory Visit: Payer: Self-pay | Admitting: Family Medicine

## 2022-01-23 ENCOUNTER — Other Ambulatory Visit: Payer: Self-pay | Admitting: Physician Assistant

## 2022-02-09 DIAGNOSIS — M8589 Other specified disorders of bone density and structure, multiple sites: Secondary | ICD-10-CM | POA: Diagnosis not present

## 2022-02-09 DIAGNOSIS — Z780 Asymptomatic menopausal state: Secondary | ICD-10-CM | POA: Diagnosis not present

## 2022-02-09 DIAGNOSIS — Z1231 Encounter for screening mammogram for malignant neoplasm of breast: Secondary | ICD-10-CM | POA: Diagnosis not present

## 2022-02-09 DIAGNOSIS — M810 Age-related osteoporosis without current pathological fracture: Secondary | ICD-10-CM | POA: Diagnosis not present

## 2022-02-09 LAB — HM DEXA SCAN

## 2022-02-09 LAB — HM MAMMOGRAPHY

## 2022-04-06 ENCOUNTER — Other Ambulatory Visit: Payer: Self-pay | Admitting: Family Medicine

## 2022-04-18 DIAGNOSIS — Z96651 Presence of right artificial knee joint: Secondary | ICD-10-CM | POA: Diagnosis not present

## 2022-05-08 ENCOUNTER — Telehealth: Payer: Self-pay

## 2022-05-08 NOTE — Telephone Encounter (Signed)
Tried to contact patient to schedule AWV.  ? ?No vm box to lvm.  ?

## 2022-06-18 ENCOUNTER — Other Ambulatory Visit: Payer: Self-pay | Admitting: Family Medicine

## 2022-08-04 ENCOUNTER — Ambulatory Visit (INDEPENDENT_AMBULATORY_CARE_PROVIDER_SITE_OTHER): Payer: Medicare Other | Admitting: Family Medicine

## 2022-08-04 VITALS — BP 128/80 | HR 78 | Temp 97.1°F | Ht 61.0 in | Wt 186.0 lb

## 2022-08-04 DIAGNOSIS — N3001 Acute cystitis with hematuria: Secondary | ICD-10-CM

## 2022-08-04 LAB — POCT URINALYSIS DIPSTICK
Bilirubin, UA: NEGATIVE
Glucose, UA: NEGATIVE
Ketones, UA: NEGATIVE
Nitrite, UA: NEGATIVE
Protein, UA: NEGATIVE
Spec Grav, UA: <=1.005 — AB (ref 1.010–1.025)
Urobilinogen, UA: 0.2 E.U./dL
pH, UA: 6.5 (ref 5.0–8.0)

## 2022-08-04 MED ORDER — SULFAMETHOXAZOLE-TRIMETHOPRIM 800-160 MG PO TABS: 1.0000 | ORAL_TABLET | Freq: Two times a day (BID) | ORAL | 0 refills | Status: DC

## 2022-08-04 NOTE — Progress Notes (Signed)
Acute Office Visit  Subjective:    Patient ID: Melanie Hamilton, female    DOB: 08-20-1946, 76 y.o.   MRN: [id number]  Chief Complaint  Patient presents with   Urinary Tract Infection    HPI: Patient is in today for low pelvic pressure with urination.  Symptoms started about 1 week.  She had some left over antibiotics that she had been given for a UTI about 3 years which restarted and completed.   Past Medical History:  Diagnosis Date   Cancer [facility]) 1989   breast cancer/ chemo therapy/ mastectomy and reconstruction   Chemotherapy adverse reaction    breast cancer-1989   Diverticulosis 2007   GERD (gastroesophageal reflux disease)    Hiatal hernia    HX: breast cancer 1989   Hyperplastic colon polyp 11/2001   Mixed hyperlipidemia    Osteoarthritis    Shingles 5/20    Past Surgical History:  Procedure Laterality Date   BREAST SURGERY  1989   reconstruction    bunion removed  01/2005   carpel tunnel release  1994   COLONOSCOPY  2007   KNEE SURGERY Right    REPLACEMENT TOTAL KNEE Right 11/2008   ROTATOR CUFF REPAIR  08/2008    Family History  Problem Relation Age of Onset   Osteoarthritis Mother    Hypertension Mother    Heart failure Father    Cancer Brother        colon cancer   Cirrhosis Brother    Diabetes Brother    Heart attack Brother    Cancer Maternal Grandfather    Heart failure Paternal Grandmother     Social History   Socioeconomic History   Marital status: Widowed    Spouse name: Not on file   Number of children: 2   Years of education: Not on file   Highest education level: Not on file  Occupational History   Not on file  Tobacco Use   Smoking status: Never   Smokeless tobacco: Never  Vaping Use   Vaping Use: Never used  Substance and Sexual Activity   Alcohol use: No   Drug use: No   Sexual activity: Not Currently    Birth control/protection: Post-menopausal  Other Topics Concern   Not on file  Social History Narrative   Patient  lives near family and has close relationship   Social Determinants of Health   Financial Resource Strain: Not on file  Food Insecurity: No Food Insecurity (11/29/2020)   Hunger Vital Sign    Worried About Running Out of Food in the Last Year: Never true    Ran Out of Food in the Last Year: Never true  Transportation Needs: No Transportation Needs (11/29/2020)   PRAPARE - Hydrologist (Medical): No    Lack of Transportation (Non-Medical): No  Physical Activity: Not on file  Stress: Not on file  Social Connections: Not on file  Intimate Partner Violence: Not on file    Outpatient Medications Prior to Visit  Medication Sig Dispense Refill   aspirin 81 MG tablet Take 81 mg by mouth daily.     atorvastatin (LIPITOR) 20 MG tablet TAKE 1 TABLET AT BEDTIME 90 tablet 0   Biotin 5000 MCG CAPS Take 1 capsule by mouth in the morning and at bedtime.     calcium carbonate (OS-CAL) 600 MG TABS Take 600 mg by mouth in the morning and at bedtime.      calcium carbonate (TUMS -  DOSED IN MG ELEMENTAL CALCIUM) 500 MG chewable tablet Chew 1 tablet by mouth daily as needed for indigestion or heartburn.     Chlorpheniramine Maleate (CHLOR-TABLETS PO) Take 1 tablet by mouth in the morning and at bedtime.     cholecalciferol (VITAMIN D) 1000 UNITS tablet Take 1,000 Units by mouth daily.     fish oil-omega-3 fatty acids 1000 MG capsule Take 1 g by mouth daily.      meloxicam (MOBIC) 7.5 MG tablet TAKE 1 TABLET BY MOUTH ONCE TO TWICE DAILY FOR SHOULDER PAIN 180 tablet 0   Multiple Vitamin (MULTIVITAMIN) capsule Take 1 capsule by mouth daily.     omeprazole (PRILOSEC) 40 MG capsule TAKE 1 CAPSULE EVERY DAY 90 capsule 3   raloxifene (EVISTA) 60 MG tablet TAKE 1 TABLET EVERY DAY 90 tablet 1   No facility-administered medications prior to visit.    No Known Allergies  Review of Systems  Constitutional:  Positive for fatigue. Negative for chills and fever.  Gastrointestinal:   Negative for nausea.       Low abdominal discomfort   Genitourinary:  Positive for dysuria and frequency.       Objective:    Physical Exam Vitals reviewed.  Constitutional:      Appearance: Normal appearance.  Cardiovascular:     Rate and Rhythm: Normal rate and regular rhythm.     Heart sounds: Normal heart sounds.  Pulmonary:     Effort: Pulmonary effort is normal. No respiratory distress.     Breath sounds: Normal breath sounds.  Abdominal:     General: Abdomen is flat. Bowel sounds are normal.     Palpations: Abdomen is soft.     Tenderness: There is no abdominal tenderness.  Neurological:     Mental Status: She is alert and oriented to person, place, and time.  Psychiatric:        Mood and Affect: Mood normal.        Behavior: Behavior normal.     BP 128/80   Pulse 78   Temp (!) 97.1 F (36.2 C)   Ht 5' 1"  (1.549 m)   Wt 186 lb (84.4 kg)   LMP 12/26/1987   BMI 35.14 kg/m  Wt Readings from Last 3 Encounters:  08/04/22 186 lb (84.4 kg)  07/26/21 183 lb 6.4 oz (83.2 kg)  05/03/21 183 lb (83 kg)    Health Maintenance Due  Topic Date Due   Hepatitis C Screening  Never done   COVID-19 Vaccine (6 - Pfizer series) 10/26/2021   INFLUENZA VACCINE  07/25/2022    There are no preventive care reminders to display for this patient.   Lab Results  Component Value Date   TSH 3.030 07/26/2021   Lab Results  Component Value Date   WBC 4.7 07/26/2021   HGB 13.7 07/26/2021   HCT 39.7 07/26/2021   MCV 94 07/26/2021   PLT 199 07/26/2021   Lab Results  Component Value Date   NA 142 07/26/2021   K 4.6 07/26/2021   CO2 24 07/26/2021   GLUCOSE 84 07/26/2021   BUN 20 07/26/2021   CREATININE 0.82 07/26/2021   BILITOT 0.7 07/26/2021   ALKPHOS 95 07/26/2021   AST 27 07/26/2021   ALT 20 07/26/2021   PROT 6.0 07/26/2021   ALBUMIN 4.4 07/26/2021   CALCIUM 10.1 07/26/2021   EGFR 75 07/26/2021   Lab Results  Component Value Date   CHOL 157 07/26/2021   Lab  Results  Component Value  Date   HDL 75 07/26/2021   Lab Results  Component Value Date   LDLCALC 70 07/26/2021   Lab Results  Component Value Date   TRIG 59 07/26/2021   Lab Results  Component Value Date   CHOLHDL 2.1 07/26/2021   No results found for: "HGBA1C"     Assessment & Plan:   Problem List Items Addressed This Visit       Genitourinary   Acute cystitis with hematuria - Primary    Rx: bactrim ds      Relevant Orders   Urine Culture (Completed)   POCT urinalysis dipstick (Completed)   Meds ordered this encounter  Medications   sulfamethoxazole-trimethoprim (BACTRIM DS) 800-160 MG tablet    Sig: Take 1 tablet by mouth 2 (two) times daily.    Dispense:  10 tablet    Refill:  0    Orders Placed This Encounter  Procedures   Urine Culture   POCT urinalysis dipstick     Follow-up: Return if symptoms worsen or fail to improve.  An After Visit Summary was printed and given to the patient.  Rochel Brome, MD [facility] [phone]

## 2022-08-07 LAB — URINE CULTURE

## 2022-08-13 ENCOUNTER — Encounter: Payer: Self-pay | Admitting: Family Medicine

## 2022-08-13 DIAGNOSIS — N3001 Acute cystitis with hematuria: Secondary | ICD-10-CM | POA: Insufficient documentation

## 2022-08-13 NOTE — Assessment & Plan Note (Signed)
Rx: bactrim ds

## 2022-08-14 NOTE — Progress Notes (Signed)
Subjective:  Patient ID: Melanie Hamilton, female    DOB: 1946/02/12  Age: 76 y.o. MRN: [id number]  Chief Complaint  Patient presents with   Hyperlipidemia   HPI Patient is a 76 year old white female who presents for follow-up of hyperlipidemia, GERD.  Denies any significant problems.  Patient eats healthy.  Patient exercises.  She mows her lawn regularly.   For her hyperlipidemia she is on Lipitor 20 mg once daily at night and aspirin 81 mg once daily. Osteoporosis patient on calcium citrate, and Evista 60 mg once daily.   Patient has GERD and is currently taking omeprazole 40 mg daily     Current Outpatient Medications on File Prior to Visit  Medication Sig Dispense Refill   aspirin 81 MG tablet Take 81 mg by mouth daily.     atorvastatin (LIPITOR) 20 MG tablet TAKE 1 TABLET AT BEDTIME 90 tablet 0   Biotin 5000 MCG CAPS Take 1 capsule by mouth in the morning and at bedtime.     Chlorpheniramine Maleate (CHLOR-TABLETS PO) Take 1 tablet by mouth in the morning and at bedtime.     cholecalciferol (VITAMIN D) 1000 UNITS tablet Take 1,000 Units by mouth daily.     fish oil-omega-3 fatty acids 1000 MG capsule Take 1 g by mouth daily.      meloxicam (MOBIC) 7.5 MG tablet TAKE 1 TABLET BY MOUTH ONCE TO TWICE DAILY FOR SHOULDER PAIN 180 tablet 0   Multiple Vitamin (MULTIVITAMIN) capsule Take 1 capsule by mouth daily.     omeprazole (PRILOSEC) 40 MG capsule TAKE 1 CAPSULE EVERY DAY 90 capsule 3   raloxifene (EVISTA) 60 MG tablet TAKE 1 TABLET EVERY DAY 90 tablet 1   No current facility-administered medications on file prior to visit.   Past Medical History:  Diagnosis Date   Cancer [facility]) 1989   breast cancer/ chemo therapy/ mastectomy and reconstruction   Chemotherapy adverse reaction    breast cancer-1989   Diverticulosis 2007   GERD (gastroesophageal reflux disease)    Hiatal hernia    HX: breast cancer 1989   Hyperplastic colon polyp 11/2001   Mixed hyperlipidemia     Osteoarthritis    Shingles 5/20   Past Surgical History:  Procedure Laterality Date   BREAST SURGERY  1989   reconstruction    bunion removed  01/2005   carpel tunnel release  1994   COLONOSCOPY  2007   KNEE SURGERY Right    REPLACEMENT TOTAL KNEE Right 11/2008   ROTATOR CUFF REPAIR  08/2008    Family History  Problem Relation Age of Onset   Osteoarthritis Mother    Hypertension Mother    Heart failure Father    Cancer Brother        colon cancer   Cirrhosis Brother    Diabetes Brother    Heart attack Brother    Cancer Maternal Grandfather    Heart failure Paternal Grandmother    Social History   Socioeconomic History   Marital status: Widowed    Spouse name: Not on file   Number of children: 2   Years of education: Not on file   Highest education level: Not on file  Occupational History   Not on file  Tobacco Use   Smoking status: Never   Smokeless tobacco: Never  Vaping Use   Vaping Use: Never used  Substance and Sexual Activity   Alcohol use: No   Drug use: No   Sexual activity: Not Currently  Birth control/protection: Post-menopausal  Other Topics Concern   Not on file  Social History Narrative   Patient lives near family and has close relationship   Social Determinants of Health   Financial Resource Strain: Not on file  Food Insecurity: No Food Insecurity (11/29/2020)   Hunger Vital Sign    Worried About Running Out of Food in the Last Year: Never true    Ran Out of Food in the Last Year: Never true  Transportation Needs: No Transportation Needs (11/29/2020)   PRAPARE - Hydrologist (Medical): No    Lack of Transportation (Non-Medical): No  Physical Activity: Not on file  Stress: Not on file  Social Connections: Not on file    Review of Systems  Constitutional:  Negative for chills, fatigue and fever.  HENT:  Negative for congestion, rhinorrhea and sore throat.   Respiratory:  Positive for shortness of breath.  Negative for cough.   Cardiovascular:  Negative for chest pain.  Gastrointestinal:  Negative for abdominal pain, constipation, diarrhea, nausea and vomiting.  Genitourinary:  Negative for dysuria and urgency.  Musculoskeletal:  Positive for back pain. Negative for myalgias.  Neurological:  Negative for dizziness, weakness, light-headedness and headaches.  Psychiatric/Behavioral:  Negative for dysphoric mood. The patient is not nervous/anxious.      Objective:  BP 136/78   Pulse 76   Temp (!) 97.3 F (36.3 C)   Resp 16   Ht '5\' 1"'$  (1.549 m)   Wt 186 lb (84.4 kg)   LMP 12/26/1987   BMI 35.14 kg/m      08/15/2022    8:34 AM 08/04/2022   11:01 AM 07/26/2021    7:37 AM  BP/Weight  Systolic BP 924 268 341  Diastolic BP 78 80 78  Wt. (Lbs) 186 186 183.4  BMI 35.14 kg/m2 35.14 kg/m2 37.04 kg/m2    Physical Exam Vitals reviewed.  Constitutional:      Appearance: Normal appearance. She is obese.  Neck:     Vascular: No carotid bruit.  Cardiovascular:     Rate and Rhythm: Normal rate and regular rhythm.     Heart sounds: Normal heart sounds.  Pulmonary:     Effort: Pulmonary effort is normal. No respiratory distress.     Breath sounds: Normal breath sounds.  Abdominal:     General: Abdomen is flat. Bowel sounds are normal.     Palpations: Abdomen is soft.     Tenderness: There is no abdominal tenderness.  Neurological:     Mental Status: She is alert and oriented to person, place, and time.  Psychiatric:        Mood and Affect: Mood normal.        Behavior: Behavior normal.     Diabetic Foot Exam - Simple   No data filed      Lab Results  Component Value Date   WBC 3.7 08/15/2022   HGB 13.6 08/15/2022   HCT 40.2 08/15/2022   PLT 244 08/15/2022   GLUCOSE 88 08/15/2022   CHOL 154 08/15/2022   TRIG 99 08/15/2022   HDL 69 08/15/2022   LDLCALC 67 08/15/2022   ALT 17 08/15/2022   AST 27 08/15/2022   NA 138 08/15/2022   K 4.3 08/15/2022   CL 100 08/15/2022    CREATININE 0.82 08/15/2022   BUN 12 08/15/2022   CO2 23 08/15/2022   TSH 3.030 07/26/2021   INR 1.0 12/17/2008      Assessment & Plan:  Problem List Items Addressed This Visit       Digestive   Gastro-esophageal reflux disease with esophagitis    Continue omeprazole 40 mg daily.         Musculoskeletal and Integument   Osteoporosis (Chronic)    Continue calcium citrate and Evista 60 mg once daily.        Genitourinary   Acute cystitis without hematuria    resolved      Relevant Orders   POCT urinalysis dipstick (Completed)     Other   Mixed hyperlipidemia - Primary    Well controlled.  No changes to medicines. Continue on Lipitor 20 mg once daily at night and aspirin 81 mg once daily. Continue to work on eating a healthy diet and exercise.  Labs drawn today.        Relevant Orders   CBC with Differential (Completed)   Comprehensive metabolic panel (Completed)   Lipid Panel (Completed)   Class 2 severe obesity due to excess calories with serious comorbidity and body mass index (BMI) of 35.0 to 35.9 in adult [facility])    Comordities include gerd and hyperlipidemia.  Recommend continue to work on eating healthy diet and exercise.      .  No orders of the defined types were placed in this encounter.   Orders Placed This Encounter  Procedures   CBC with Differential   Comprehensive metabolic panel   Lipid Panel   POCT urinalysis dipstick     Follow-up: Return in about 1 year (around 08/16/2023) for chronic fasting, needs awv this fall with Maudie Mercury. .  An After Visit Summary was printed and given to the patient.  Rochel Brome, MD [facility] [phone]

## 2022-08-15 ENCOUNTER — Ambulatory Visit (INDEPENDENT_AMBULATORY_CARE_PROVIDER_SITE_OTHER): Payer: Medicare Other | Admitting: Family Medicine

## 2022-08-15 ENCOUNTER — Encounter: Payer: Self-pay | Admitting: Family Medicine

## 2022-08-15 VITALS — BP 136/78 | HR 76 | Temp 97.3°F | Resp 16 | Ht 61.0 in | Wt 186.0 lb

## 2022-08-15 DIAGNOSIS — E782 Mixed hyperlipidemia: Secondary | ICD-10-CM

## 2022-08-15 DIAGNOSIS — N3000 Acute cystitis without hematuria: Secondary | ICD-10-CM | POA: Diagnosis not present

## 2022-08-15 DIAGNOSIS — K219 Gastro-esophageal reflux disease without esophagitis: Secondary | ICD-10-CM

## 2022-08-15 DIAGNOSIS — K2100 Gastro-esophageal reflux disease with esophagitis, without bleeding: Secondary | ICD-10-CM | POA: Diagnosis not present

## 2022-08-15 DIAGNOSIS — M810 Age-related osteoporosis without current pathological fracture: Secondary | ICD-10-CM | POA: Diagnosis not present

## 2022-08-15 DIAGNOSIS — Z6835 Body mass index (BMI) 35.0-35.9, adult: Secondary | ICD-10-CM

## 2022-08-15 DIAGNOSIS — E6609 Other obesity due to excess calories: Secondary | ICD-10-CM

## 2022-08-15 LAB — POCT URINALYSIS DIPSTICK
Bilirubin, UA: NEGATIVE
Blood, UA: NEGATIVE
Glucose, UA: NEGATIVE
Ketones, UA: NEGATIVE
Leukocytes, UA: NEGATIVE
Nitrite, UA: NEGATIVE
Protein, UA: NEGATIVE
Spec Grav, UA: 1.01 (ref 1.010–1.025)
Urobilinogen, UA: 0.2 E.U./dL
pH, UA: 6.5 (ref 5.0–8.0)

## 2022-08-16 LAB — CBC WITH DIFFERENTIAL/PLATELET
Basophils Absolute: 0 10*3/uL (ref 0.0–0.2)
Basos: 1 %
EOS (ABSOLUTE): 0.2 10*3/uL (ref 0.0–0.4)
Eos: 5 %
Hematocrit: 40.2 % (ref 34.0–46.6)
Hemoglobin: 13.6 g/dL (ref 11.1–15.9)
Immature Grans (Abs): 0 10*3/uL (ref 0.0–0.1)
Immature Granulocytes: 0 %
Lymphocytes Absolute: 1.2 10*3/uL (ref 0.7–3.1)
Lymphs: 32 %
MCH: 30.8 pg (ref 26.6–33.0)
MCHC: 33.8 g/dL (ref 31.5–35.7)
MCV: 91 fL (ref 79–97)
Monocytes Absolute: 0.4 10*3/uL (ref 0.1–0.9)
Monocytes: 11 %
Neutrophils Absolute: 1.9 10*3/uL (ref 1.4–7.0)
Neutrophils: 51 %
Platelets: 244 10*3/uL (ref 150–450)
RBC: 4.42 x10E6/uL (ref 3.77–5.28)
RDW: 13.5 % (ref 11.7–15.4)
WBC: 3.7 10*3/uL (ref 3.4–10.8)

## 2022-08-16 LAB — COMPREHENSIVE METABOLIC PANEL
ALT: 17 IU/L (ref 0–32)
AST: 27 IU/L (ref 0–40)
Albumin/Globulin Ratio: 2.4 — ABNORMAL HIGH (ref 1.2–2.2)
Albumin: 4.5 g/dL (ref 3.8–4.8)
Alkaline Phosphatase: 85 IU/L (ref 44–121)
BUN/Creatinine Ratio: 15 (ref 12–28)
BUN: 12 mg/dL (ref 8–27)
Bilirubin Total: 0.5 mg/dL (ref 0.0–1.2)
CO2: 23 mmol/L (ref 20–29)
Calcium: 9.6 mg/dL (ref 8.7–10.3)
Chloride: 100 mmol/L (ref 96–106)
Creatinine, Ser: 0.82 mg/dL (ref 0.57–1.00)
Globulin, Total: 1.9 g/dL (ref 1.5–4.5)
Glucose: 88 mg/dL (ref 70–99)
Potassium: 4.3 mmol/L (ref 3.5–5.2)
Sodium: 138 mmol/L (ref 134–144)
Total Protein: 6.4 g/dL (ref 6.0–8.5)
eGFR: 75 mL/min/{1.73_m2} (ref >59)

## 2022-08-16 LAB — LIPID PANEL
Chol/HDL Ratio: 2.2 ratio (ref 0.0–4.4)
Cholesterol, Total: 154 mg/dL (ref 100–199)
HDL: 69 mg/dL (ref >39)
LDL Chol Calc (NIH): 67 mg/dL (ref 0–99)
Triglycerides: 99 mg/dL (ref 0–149)
VLDL Cholesterol Cal: 18 mg/dL (ref 5–40)

## 2022-08-19 DIAGNOSIS — N3000 Acute cystitis without hematuria: Secondary | ICD-10-CM | POA: Insufficient documentation

## 2022-08-19 NOTE — Assessment & Plan Note (Signed)
Well controlled.  No changes to medicines. Continue on Lipitor 20 mg once daily at night and aspirin 81 mg once daily. Continue to work on eating a healthy diet and exercise.  Labs drawn today.

## 2022-08-19 NOTE — Assessment & Plan Note (Signed)
resolved 

## 2022-08-19 NOTE — Assessment & Plan Note (Signed)
Comordities include gerd and hyperlipidemia.  Recommend continue to work on eating healthy diet and exercise.

## 2022-08-19 NOTE — Assessment & Plan Note (Signed)
Continue omeprazole 40 mg daily

## 2022-08-19 NOTE — Assessment & Plan Note (Signed)
Continue calcium citrate and Evista 60 mg once daily.

## 2022-08-25 ENCOUNTER — Other Ambulatory Visit: Payer: Self-pay

## 2022-08-25 MED ORDER — MELOXICAM 7.5 MG PO TABS: ORAL_TABLET | ORAL | 0 refills | Status: DC

## 2022-08-30 ENCOUNTER — Ambulatory Visit (INDEPENDENT_AMBULATORY_CARE_PROVIDER_SITE_OTHER): Payer: Medicare Other

## 2022-08-30 VITALS — BP 138/74 | HR 82 | Resp 16 | Ht 61.0 in | Wt 187.0 lb

## 2022-08-30 DIAGNOSIS — Z0000 Encounter for general adult medical examination without abnormal findings: Secondary | ICD-10-CM | POA: Diagnosis not present

## 2022-08-30 MED ORDER — RALOXIFENE HCL 60 MG PO TABS: 60.0000 mg | ORAL_TABLET | Freq: Every day | ORAL | 2 refills | Status: DC

## 2022-09-01 NOTE — Patient Instructions (Signed)
Melanie Hamilton , Thank you for taking time to come for your Medicare Wellness Visit. I appreciate your ongoing commitment to your health goals. Please review the following plan we discussed and let me know if I can assist you in the future.   Screening recommendations/referrals: Health Maintenance  Topic Date Due   COVID-19 Vaccine Booster Fall   INFLUENZA VACCINE  Due   MAMMOGRAM  02/09/2023   COLONOSCOPY (Pts 45-11yr Insurance coverage will need to be confirmed)  09/22/2025   TETANUS/TDAP  08/13/2030   Pneumonia Vaccine 75 Years old  Completed   DEXA SCAN  Completed   Zoster Vaccines- Shingrix  Completed   Recommended yearly ophthalmology/optometry visit for glaucoma screening and checkup  Recommended yearly dental visit for hygiene and checkup  Advanced directives: Please bring a copy for your medical record  Conditions/risks identified: Patient assistance application submitted to [employer] Evista    Preventive Care 65 Years and Older, Female   Preventive care refers to lifestyle choices and visits with your health care provider that can promote health and wellness.  What does preventive care include? A yearly physical exam. This is also called an annual well check. Dental exams once or twice a year. Routine eye exams. Ask your health care provider how often you should have your eyes checked. Personal lifestyle choices, including: Daily care of your teeth and gums. Regular physical activity. Eating a healthy diet. Avoiding tobacco and drug use. Limiting alcohol use. Practicing safe sex. Taking low-dose aspirin every day. Taking vitamin and mineral supplements as recommended by your health care provider.  What happens during an annual well check? The services and screenings done by your health care provider during your annual well check will depend on your age, overall health, lifestyle risk factors, and family history of disease.  Counseling Your health care provider  may ask you questions about your: Alcohol use. Tobacco use. Drug use. Emotional well-being. Home and relationship well-being. Sexual activity. Eating habits. History of falls. Memory and ability to understand (cognition). Work and work [employer]ro[employer] health.  Screening You may have the following tests or measurements: Height, weight, and BMI. Blood pressure. Lipid and cholesterol levels. These may be checked every 5 years, or more frequently if you are over 570years old. Skin check. Lung cancer screening. You may have this screening every year starting at age 212[address]if you have a 30-pack-year history of smoking and currently smoke or have quit within the past 15 years. Fecal occult blood test (FOBT) of the stool. You may have this test every year starting at age 21289 Flexible sigmoidoscopy or colonoscopy. You may have a sigmoidoscopy every 5 years or a colonoscopy every 10 years starting at age 21216 Hepatitis C blood test. Hepatitis B blood test. Sexually transmitted disease (STD) testing. Diabetes screening. This is done by checking your blood sugar (glucose) after you have not eaten for a while (fasting). You may have this done every 1-3 years. Bone density scan. This is done to screen for osteoporosis. You may have this done starting at age 75 Mammogram. This may be done every 1-2 years. Talk to your health care provider about how often you should have regular mammograms. Talk with your health care provider about your test results, treatment options, and if necessary, the need for more tests.  Vaccines Your health care provider may recommend certain vaccines, such as: Influenza vaccine. This is recommended every year. Tetanus, diphtheria, and acellular pertussis (Tdap, Td) vaccine. You may need a Td  booster every 10 years. Zoster vaccine. You may need this after age 58. Pneumococcal 13-valent conjugate (PCV13) vaccine. One dose is recommended after age 39. Pneumococcal  polysaccharide (PPSV23) vaccine. One dose is recommended after age 66. Talk to your health care provider about which screenings and vaccines you need and how often you need them.  This information is not intended to replace advice given to you by your health care provider. Make sure you discuss any questions you have with your health care provider. Document Released: 01/07/2016 Document Revised: 08/30/2016 Document Reviewed: 10/12/2015 Elsevier Interactive Patient Education  2017 [place] Prevention in the Home  Falls can cause injuries. They can happen to people of all ages. There are many things you can do to make your home safe and to help prevent falls.  What can I do on the outside of my home? Regularly fix the edges of walkways and driveways and fix any cracks. Remove anything that might make you trip as you walk through a door, such as a raised step or threshold. Trim any bushes or trees on the path to your home. Use bright outdoor lighting. Clear any walking paths of anything that might make someone trip, such as rocks or tools. Regularly check to see if handrails are loose or broken. Make sure that both sides of any steps have handrails. Any raised decks and porches should have guardrails on the edges. Have any leaves, snow, or ice cleared regularly. Use sand or salt on walking paths during winter. Clean up any spills in your garage right away. This includes oil or grease spills.  What can I do in the bathroom? Use night lights. Install grab bars by the toilet and in the tub and shower. Do not use towel bars as grab bars. Use non-skid mats or decals in the tub or shower. If you need to sit down in the shower, use a plastic, non-slip stool. Keep the floor dry. Clean up any water that spills on the floor as soon as it happens. Remove soap buildup in the tub or shower regularly. Attach bath mats securely with double-sided non-slip rug tape. Do not have throw rugs  and other things on the floor that can make you trip.  What can I do in the bedroom? Use night lights. Make sure that you have a light by your bed that is easy to reach. Do not use any sheets or blankets that are too big for your bed. They should not hang down onto the floor. Have a firm chair that has side arms. You can use this for support while you get dressed. Do not have throw rugs and other things on the floor that can make you trip.  What can I do in the kitchen? Clean up any spills right away. Avoid walking on wet floors. Keep items that you use a lot in easy-to-reach places. If you need to reach something above you, use a strong step stool that has a grab bar. Keep electrical cords out of the way. Do not use floor polish or wax that makes floors slippery. If you must use wax, use non-skid floor wax. Do not have throw rugs and other things on the floor that can make you trip.  What can I do with my stairs? Do not leave any items on the stairs. Make sure that there are handrails on both sides of the stairs and use them. Fix handrails that are broken or loose. Make sure that handrails are  as long as the stairways. Check any carpeting to make sure that it is firmly attached to the stairs. Fix any carpet that is loose or worn. Avoid having throw rugs at the top or bottom of the stairs. If you do have throw rugs, attach them to the floor with carpet tape. Make sure that you have a light switch at the top of the stairs and the bottom of the stairs. If you do not have them, ask someone to add them for you.  What else can I do to help prevent falls? Wear shoes that: Do not have high heels. Have rubber bottoms. Are comfortable and fit you well. Are closed at the toe. Do not wear sandals. If you use a stepladder: Make sure that it is fully opened. Do not climb a closed stepladder. Make sure that both sides of the stepladder are locked into place. Ask someone to hold it for you, if  possible. Clearly mark and make sure that you can see: Any grab bars or handrails. First and last steps. Where the edge of each step is. Use tools that help you move around (mobility aids) if they are needed. These include: Canes. Walkers. Scooters. Crutches. Turn on the lights when you go into a dark area. Replace any light bulbs as soon as they burn out. Set up your furniture so you have a clear path. Avoid moving your furniture around. If any of your floors are uneven, fix them. If there are any pets around you, be aware of where they are. Review your medicines with your doctor. Some medicines can make you feel dizzy. This can increase your chance of falling. Ask your doctor what other things that you can do to help prevent falls.  This information is not intended to replace advice given to you by your health care provider. Make sure you discuss any questions you have with your health care provider. Document Released: 10/07/2009 Document Revised: 05/18/2016 Document Reviewed: 01/15/2015 Elsevier Interactive Patient Education  2017 [employer].

## 2022-09-01 NOTE — Progress Notes (Signed)
Subjective:   MADELIENE Hamilton is a 76 y.o. female who presents for Medicare Annual (Subsequent) preventive examination.  This wellness visit is conducted by a nurse.  The patient's medications were reviewed and reconciled since the patient's last visit.  History details were provided by the patient.  The history appears to be reliable.    Medical History: Patient history and Family history was reviewed  Medications, Allergies, and preventative health maintenance was reviewed and updated.   Cardiac Risk Factors include: advanced age (>32mn, >>33women);diabetes mellitus;obesity (BMI >30kg/m2)     Objective:    Today's Vitals   08/30/22 1105  BP: 138/74  Pulse: 82  Resp: 16  SpO2: 93%  Weight: 187 lb (84.8 kg)  Height: '5\' 1"'$  (1.549 m)  PainSc: 0-No pain   Body mass index is 35.33 kg/m.     09/01/2022    8:44 AM 05/03/2021   10:01 AM 04/29/2020    9:21 AM  Advanced Directives  Does Patient Have a Medical Advance Directive? Yes Yes Yes  Type of AParamedicof [place]outh [place] will [place] will [place] will  Does patient want to make changes to medical advance directive? No - Patient declined No - Patient declined No - Patient declined  Copy of [place] Chart? No - copy requested No - copy requested No - copy requested    Current Medications (verified) Outpatient Encounter Medications as of 08/30/2022  Medication Sig   aspirin 81 MG tablet Take 81 mg by mouth daily.   atorvastatin (LIPITOR) 20 MG tablet TAKE 1 TABLET AT BEDTIME   Biotin 5000 MCG CAPS Take 1 capsule by mouth in the morning and at bedtime.   Chlorpheniramine Maleate (CHLOR-TABLETS PO) Take 1 tablet by mouth in the morning and at bedtime.   cholecalciferol (VITAMIN D) 1000 UNITS tablet Take 1,000 Units by mouth daily.   fish oil-omega-3 fatty acids 1000 MG capsule Take 1 g by mouth daily.    meloxicam (MOBIC) 7.5 MG  tablet Twice daily as needed for shoulder pain   Multiple Vitamin (MULTIVITAMIN) capsule Take 1 capsule by mouth daily.   omeprazole (PRILOSEC) 40 MG capsule TAKE 1 CAPSULE EVERY DAY   raloxifene (EVISTA) 60 MG tablet Take 1 tablet (60 mg total) by mouth daily.   [DISCONTINUED] raloxifene (EVISTA) 60 MG tablet TAKE 1 TABLET EVERY DAY   [DISCONTINUED] raloxifene (EVISTA) 60 MG tablet Take 1 tablet (60 mg total) by mouth daily.   [DISCONTINUED] raloxifene (EVISTA) 60 MG tablet Take 1 tablet (60 mg total) by mouth daily.   No facility-administered encounter medications on file as of 08/30/2022.    Allergies (verified) Patient has no known allergies.   History: Past Medical History:  Diagnosis Date   [place]   breast cancer/ chemo therapy/ mastectomy and reconstruction   Chemotherapy adverse reaction    breast cancer-1989   Diverticulosis 2007   GERD (gastroesophageal reflux disease)    Hiatal hernia    HX: breast cancer 1989   Hyperplastic colon polyp 11/2001   Mixed hyperlipidemia    Osteoarthritis    Shingles 5/20   Past Surgical History:  Procedure Laterality Date   BREAST SURGERY  1989   reconstruction    bunion removed  01/2005   carpel tunnel release  1994   COLONOSCOPY  2007   KNEE SURGERY Right    REPLACEMENT TOTAL KNEE Right 11/2008   ROTATOR CUFF REPAIR  08/2008  Family History  Problem Relation Age of Onset   Osteoarthritis Mother    Hypertension Mother    Heart failure Father    Cancer Brother        colon cancer   Cirrhosis Brother    Diabetes Brother    Heart attack Brother    Cancer Maternal Grandfather    Heart failure Paternal Grandmother    Breast cancer Niece    Social History   Socioeconomic History   Marital status: Widowed    Spouse name: Not on file   Number of children: 2   Years of education: Not on file   Highest education level: Not on file  Occupational History   Not on file  Tobacco Use   Smoking status: Never    Smokeless tobacco: Never  Vaping Use   Vaping Use: Never used  Substance and Sexual Activity   Alcohol use: Never   Drug use: Never   Sexual activity: Not Currently    Birth control/protection: Post-menopausal  Other Topics Concern   Not on file  Social History Narrative   Patient lives near family and has close relationship   Social Determinants of Health   Financial Resource Strain: Not on file  Food Insecurity: No Food Insecurity (11/29/2020)   Hunger Vital Sign    Worried About Running Out of Food in the Last Year: Never true    Ran Out of Food in the Last Year: Never true  Transportation Needs: No Transportation Needs (11/29/2020)   PRAPARE - Hydrologist (Medical): No    Lack of Transportation (Non-Medical): No  Physical Activity: Not on file  Stress: Not on file  Social Connections: Not on file    Tobacco Counseling Counseling given: Patient does not use tobacco products   Clinical Intake:  Pre-visit preparation completed: Yes Pain : No/denies pain Pain Score: 0-No pain   BMI - recorded: 35.33 Nutritional Status: BMI > 30  Obese Nutritional Risks: None Diabetes: No How often do you need to have someone help you when you read instructions, pamphlets, or other written materials from your doctor or pharmacy?: 1 - Never Interpreter Needed?: No    Activities of Daily Living    08/30/2022   11:15 AM 08/29/2022   12:39 PM  In your present state of health, do you have any difficulty performing the following activities:  Hearing? 0 0  Vision? 0 0  Difficulty concentrating or making decisions? 0 0  Walking or climbing stairs? 1 1  Dressing or bathing? 0 0  Doing errands, shopping? 0 0  Preparing Food and eating ? N N  Using the Toilet? N N  In the past six months, have you accidently leaked urine? Y Y  Do you have problems with loss of bowel control? N N  Managing your Medications? N N  Managing your Finances? N N  Housekeeping or  managing your Housekeeping? N N    Patient Care Team: CoxElnita Maxwell, MD as PCP - General (Family Medicine) Lynnell Dike, OD (Optometry)      Assessment:   This is a routine wellness examination for [place].  Hearing/Vision screen No results found.  Dietary issues and exercise activities discussed: Current Exercise Habits: Home exercise routine, Type of exercise: walking, Time (Minutes): 30, Frequency (Times/Week): 5, Weekly Exercise (Minutes/Week): 150, Intensity: Mild, Exercise limited by: None identified   Depression Screen    09/01/2022    8:46 AM 08/15/2022    8:38 AM 07/26/2021  7:41 AM 05/03/2021   10:04 AM 11/29/2020    9:23 AM 04/29/2020    9:22 AM  PHQ 2/9 Scores  PHQ - 2 Score 0 0 0 0 0 0    Fall Risk    08/30/2022   11:15 AM 08/29/2022   12:39 PM 08/15/2022    8:38 AM 07/26/2021    7:41 AM 05/03/2021   10:03 AM  Fall Risk   Falls in the past year? 0 0 0 0 0  Number falls in past yr: 0  0 0 0  Injury with Fall? 0  0 0 0  Risk for fall due to : No Fall Risks  No Fall Risks  No Fall Risks  Follow up Falls evaluation completed;Falls prevention discussed  Falls evaluation completed  Falls evaluation completed;Falls prevention discussed    FALL RISK PREVENTION PERTAINING TO THE HOME:  Any stairs in or around the home? Yes  If so, are there any without handrails? No  Home free of loose throw rugs in walkways, pet beds, electrical cords, etc? Yes  Adequate lighting in your home to reduce risk of falls? Yes   ASSISTIVE DEVICES UTILIZED TO PREVENT FALLS:  Life alert? No  Use of a cane, walker or w/c? No  Grab bars in the bathroom? No  Shower chair or bench in shower? No  Elevated toilet seat or a handicapped toilet? No   Gait steady and fast without use of assistive device  Cognitive Function:        09/01/2022    8:48 AM 05/03/2021   10:25 AM 04/29/2020    9:23 AM  6CIT Screen  What Year? 0 points 0 points 0 points  What month? 0 points 0 points 0 points  What  time? 0 points 0 points 0 points  Count back from 20 0 points 0 points 0 points  Months in reverse 0 points 0 points 0 points  Repeat phrase 0 points 0 points 0 points  Total Score 0 points 0 points 0 points    Immunizations Immunization History  Administered Date(s) Administered   DTaP 12/25/2008   Influenza, High Dose Seasonal PF 10/16/2018   Influenza-Unspecified 12/25/2018, 10/12/2020, 10/29/2021   Moderna Sars-Covid-2 Vaccination 04/18/2021, 08/31/2021   PFIZER(Purple Top)SARS-COV-2 Vaccination 01/10/2020, 01/31/2020, 09/23/2020   Pneumococcal Conjugate-13 02/26/2015   Pneumococcal Polysaccharide-23 11/09/2017   Pneumococcal-Unspecified 12/25/2013   Td 02/15/2010   Tdap 08/13/2020   Zoster Recombinat (Shingrix) 09/24/2017, 12/08/2017   Zoster, Live 10/21/2014    TDAP status: Up to date  Flu Vaccine status: Due, Education has been provided regarding the importance of this vaccine. Advised may receive this vaccine at local pharmacy or Health Dept. Aware to provide a copy of the vaccination record if obtained from local pharmacy or Health Dept. Verbalized acceptance and understanding.  Pneumococcal vaccine status: Up to date  Qualifies for Shingles Vaccine? Yes   Zostavax completed No   Shingrix Completed?: Yes  Screening Tests Health Maintenance  Topic Date Due   Hepatitis C Screening  Never done   COVID-19 Vaccine (6 - Pfizer series) 10/26/2021   INFLUENZA VACCINE  07/25/2022   MAMMOGRAM  02/09/2023   COLONOSCOPY (Pts 45-54yr Insurance coverage will need to be confirmed)  09/22/2025   TETANUS/TDAP  08/13/2030   Pneumonia Vaccine 75 Years old  Completed   DEXA SCAN  Completed   Zoster Vaccines- Shingrix  Completed   HPV VACCINES  Aged Out    Health Maintenance  Health Maintenance Due  Topic Date  Due   Hepatitis C Screening  Never done   COVID-19 Vaccine (6 - Pfizer series) 10/26/2021   INFLUENZA VACCINE  07/25/2022    Colorectal cancer screening: Type of  screening: Colonoscopy. Completed 09/22/20. Repeat every 5 years  Mammogram status: Completed 02/09/22. Repeat every year  Bone Density status: Completed 02/09/22. Results reflect: Bone density results: OSTEOPOROSIS. Repeat every 2 years.  Lung Cancer Screening: (Low Dose CT Chest recommended if Age 89-80 years, 30 pack-year currently smoking OR have quit w/in 15years.) does not qualify.   Additional Screening:  Vision Screening: Recommended annual ophthalmology exams for early detection of glaucoma and other disorders of the eye. Is the patient up to date with their annual eye exam?  Yes  Who is the provider or what is the name of the office in which the patient attends annual eye exams? [facility]  Dental Screening: Recommended annual dental exams for proper oral hygiene  Community Resource Referral / Chronic Care Management: CRR required this visit?  No   CCM required this visit?  No      Plan:    1- Come back by for a flu shot next week 2- Follow-up on patient assistance application for [employer] 3- Diet/Weight loss - discussed healthy diet and modifications to make  I have personally reviewed and noted the following in the patient's chart:   Medical and social history Use of alcohol, tobacco or illicit drugs  Current medications and supplements including opioid prescriptions. Patient is not currently taking opioid prescriptions. Functional ability and status Nutritional status Physical activity Advanced directives List of other physicians Hospitalizations, surgeries, and ER visits in previous 12 months Vitals Screenings to include cognitive, depression, and falls Referrals and appointments  In addition, I have reviewed and discussed with patient certain preventive protocols, quality metrics, and best practice recommendations. A written personalized care plan for preventive services as well as general preventive health recommendations were provided to patient.      Erie Noe, LPN   07/01/5884

## 2022-10-18 DIAGNOSIS — Z23 Encounter for immunization: Secondary | ICD-10-CM | POA: Diagnosis not present

## 2022-10-26 ENCOUNTER — Other Ambulatory Visit: Payer: Self-pay | Admitting: Physician Assistant

## 2022-11-01 ENCOUNTER — Other Ambulatory Visit: Payer: Self-pay | Admitting: Family Medicine

## 2023-01-04 DIAGNOSIS — H18413 Arcus senilis, bilateral: Secondary | ICD-10-CM | POA: Diagnosis not present

## 2023-01-04 DIAGNOSIS — H25013 Cortical age-related cataract, bilateral: Secondary | ICD-10-CM | POA: Diagnosis not present

## 2023-01-04 DIAGNOSIS — H2513 Age-related nuclear cataract, bilateral: Secondary | ICD-10-CM | POA: Diagnosis not present

## 2023-01-04 DIAGNOSIS — H35372 Puckering of macula, left eye: Secondary | ICD-10-CM | POA: Diagnosis not present

## 2023-01-04 DIAGNOSIS — H524 Presbyopia: Secondary | ICD-10-CM | POA: Diagnosis not present

## 2023-01-23 ENCOUNTER — Encounter: Payer: Self-pay | Admitting: Obstetrics & Gynecology

## 2023-01-23 ENCOUNTER — Ambulatory Visit: Payer: Medicare Other | Admitting: Physician Assistant

## 2023-01-23 ENCOUNTER — Ambulatory Visit (INDEPENDENT_AMBULATORY_CARE_PROVIDER_SITE_OTHER): Payer: Medicare Other | Admitting: Obstetrics & Gynecology

## 2023-01-23 VITALS — BP 124/82 | HR 78 | Temp 98.9°F

## 2023-01-23 DIAGNOSIS — N8110 Cystocele, unspecified: Secondary | ICD-10-CM

## 2023-01-23 DIAGNOSIS — R102 Pelvic and perineal pain: Secondary | ICD-10-CM

## 2023-01-23 NOTE — Progress Notes (Signed)
    Melanie Hamilton 15-Sep-1946 [id number]        76 y.o.  G2P0002   RP: Progressive vaginal bulge with lower abdominal discomfort x yesterday.  Abstinent.  HPI: Progressive vaginal bulge with lower abdominal discomfort x yesterday. No PMB.  No vaginal discharge.  Minimal SUI.  Can empty her bladder well.  No UTI Sx otherwise.  No fever.     OB History  Gravida Para Term Preterm AB Living  2 2 0 0 0 2  SAB IAB Ectopic Multiple Live Births  0 0 0 0 2    # Outcome Date GA Lbr Len/2nd Weight Sex Delivery Anes PTL Lv  2 Para 1977    M Vag-Spont   LIV  1 Para 1974    M Vag-Spont   LIV    Past medical history,surgical history, problem list, medications, allergies, family history and social history were all reviewed and documented in the EPIC chart.   Directed ROS with pertinent positives and negatives documented in the history of present illness/assessment and plan.  Exam:  Vitals:   01/23/23 1103  BP: 124/82  Pulse: 78  Temp: 98.9 F (37.2 C)  TempSrc: Oral  SpO2: 97%   General appearance:  Normal  CVAT Neg bilaterally  Abdomen: Normal  Gynecologic exam: Vulva normal.  Bimanual exam:  Uterus mildly bulky/nodular, mobile, NT.  No adnexal mass felt, NT bilaterally.   Exam with valsalva laying down and standing: Cystocele grade 3/4.  No Uterine prolapse, no rectocele.  U/A: Dark yellow, slightly cloudy, Pro Neg, Nit Neg, WBC 6-10, RBC Neg, Bacteria Few.  Pending U. Culture.   Assessment/Plan:  77 y.o. G2P0002   1. Pelvic pain Progressive vaginal bulge with lower abdominal discomfort x yesterday. No PMB.  No vaginal discharge.  Minimal SUI.  Can empty her bladder well.  No UTI Sx otherwise.  No fever. U/A mildly disturbed, will wait on U. Culture to decide if treatment is indicated.  Bimanual exam:  Uterus mildly bulky/nodular, mobile, NT.  No adnexal mass felt, NT bilaterally.  Will further investigate with a Pelvic US at f/u. - Urinalysis,Complete w/RFL Culture - US  Transvaginal Non-OB; Future  2. Baden-Walker grade 3 cystocele Progressive vaginal bulge with lower abdominal discomfort x yesterday. Exam with valsalva laying down and standing: Cystocele grade 3/4.  No Uterine prolapse, no rectocele.  Pessary management discussed and explained, risks/benefits reviewed, prefer observation at this time.  Kegels recommended.  Avoidance of pelvic floor pressure recommended.  Recommend emptying her bladder before it is too full. - US Transvaginal Non-OB; Future  Other orders - VITAMIN D PO; Take 5,000 Int'l Units by mouth. 2-3 times weekly - Calcium Citrate-Vitamin D (CALCIUM + D PO); Take by mouth. Twice daily - Naproxen Sodium (ALEVE PO); Take by mouth as needed. - Urine Culture - REFLEXIVE URINE CULTURE   Princess Bruins MD, 11:16 AM 01/23/2023

## 2023-01-25 LAB — URINALYSIS, COMPLETE W/RFL CULTURE
Bilirubin Urine: NEGATIVE
Casts: NONE SEEN /LPF
Crystals: NONE SEEN /HPF
Glucose, UA: NEGATIVE
Hgb urine dipstick: NEGATIVE
Hyaline Cast: NONE SEEN /LPF
Ketones, ur: NEGATIVE
Nitrites, Initial: NEGATIVE
Protein, ur: NEGATIVE
RBC / HPF: NONE SEEN /HPF (ref 0–2)
Specific Gravity, Urine: 1.015 (ref 1.001–1.035)
Yeast: NONE SEEN /HPF
pH: 6 (ref 5.0–8.0)

## 2023-01-25 LAB — CULTURE INDICATED

## 2023-01-25 LAB — URINE CULTURE
MICRO NUMBER:: 14492444
Result:: NO GROWTH
SPECIMEN QUALITY:: ADEQUATE

## 2023-01-30 ENCOUNTER — Other Ambulatory Visit: Payer: Self-pay

## 2023-01-30 MED ORDER — OMEPRAZOLE 40 MG PO CPDR: 40.0000 mg | DELAYED_RELEASE_CAPSULE | Freq: Every day | ORAL | 3 refills | Status: DC

## 2023-01-30 MED ORDER — ATORVASTATIN CALCIUM 20 MG PO TABS: 20.0000 mg | ORAL_TABLET | Freq: Every day | ORAL | 10 refills | Status: DC

## 2023-01-30 MED ORDER — MELOXICAM 7.5 MG PO TABS: ORAL_TABLET | ORAL | 1 refills | Status: DC

## 2023-02-15 DIAGNOSIS — Z1231 Encounter for screening mammogram for malignant neoplasm of breast: Secondary | ICD-10-CM | POA: Diagnosis not present

## 2023-02-16 ENCOUNTER — Encounter: Payer: Self-pay | Admitting: Obstetrics & Gynecology

## 2023-02-19 ENCOUNTER — Encounter: Payer: Self-pay | Admitting: Family Medicine

## 2023-03-08 ENCOUNTER — Ambulatory Visit (INDEPENDENT_AMBULATORY_CARE_PROVIDER_SITE_OTHER): Payer: Medicare Other | Admitting: Obstetrics & Gynecology

## 2023-03-08 ENCOUNTER — Encounter: Payer: Self-pay | Admitting: Obstetrics & Gynecology

## 2023-03-08 ENCOUNTER — Ambulatory Visit (INDEPENDENT_AMBULATORY_CARE_PROVIDER_SITE_OTHER): Payer: Medicare Other

## 2023-03-08 VITALS — BP 110/80 | HR 73

## 2023-03-08 DIAGNOSIS — N8110 Cystocele, unspecified: Secondary | ICD-10-CM | POA: Diagnosis not present

## 2023-03-08 DIAGNOSIS — R102 Pelvic and perineal pain unspecified side: Secondary | ICD-10-CM

## 2023-03-08 NOTE — Progress Notes (Signed)
    Melanie Hamilton 06/29/1946 [id number]        76 y.o.  G2P0002   RP: Pelvic Pain for Pelvic US  HPI: Patient presented on 01/23/2023:  Progressive vaginal bulge with lower abdominal discomfort x yesterday. No PMB. No vaginal discharge. Minimal SUI. Can empty her bladder well. No UTI Sx otherwise. No fever.   Since then, the lower abdominal discomfort has resolved.  No complaint today.   OB History  Gravida Para Term Preterm AB Living  2 2 0 0 0 2  SAB IAB Ectopic Multiple Live Births  0 0 0 0 2    # Outcome Date GA Lbr Len/2nd Weight Sex Delivery Anes PTL Lv  2 Para 1977    M Vag-Spont   LIV  1 Para 1974    M Vag-Spont   LIV    Past medical history,surgical history, problem list, medications, allergies, family history and social history were all reviewed and documented in the EPIC chart.   Directed ROS with pertinent positives and negatives documented in the history of present illness/assessment and plan.  Exam:  Vitals:   03/08/23 0949  BP: 110/80  Pulse: 73  SpO2: 97%   General appearance:  Normal  Pelvic US today: T/V images.  Small anteverted uterus normal in size and shape with no myometrial mass.  The uterus is measured at 6.4 x 3.35 x 2.2 cm.  The endometrial lining is thin and symmetrical, measured at 1.97 mm, with no mass or thickening or abnormal blood flow seen.  Both ovaries are small with atrophic appearance.  No adnexal mass or free fluid seen transabdominally or transvaginally.   Assessment/Plan:  77 y.o. G2P0002   1. Pelvic pain Patient presented on 01/23/2023:  Progressive vaginal bulge with lower abdominal discomfort x yesterday. No PMB. No vaginal discharge. Minimal SUI. Can empty her bladder well. No UTI Sx otherwise. No fever.   Since then, the lower abdominal discomfort has resolved.  No complaint today. Pelvic US findings reviewed with patient.  Reassured that her uterus and ovaries appear normal, with no pelvic mass or FF seen.  2. Baden-Walker  grade 3 cystocele  Mildly symptomatic, will observe.  Princess Bruins MD, 10:24 AM 03/08/2023

## 2023-07-06 ENCOUNTER — Other Ambulatory Visit: Payer: Self-pay | Admitting: Family Medicine

## 2023-08-16 NOTE — Assessment & Plan Note (Signed)
Continue calcium citrate and Evista 60 mg once daily.

## 2023-08-16 NOTE — Assessment & Plan Note (Addendum)
Well controlled.  No changes to medicines. Continue on Lipitor 20 mg once daily at night and aspirin 81 mg once daily, fish oil. Continue to work on eating a healthy diet and exercise.  Labs drawn today.

## 2023-08-16 NOTE — Progress Notes (Unsigned)
Subjective:  Patient ID: Melanie Hamilton, female    DOB: [birth year]  Age: 77 y.o. MRN: [id number]  Chief Complaint  Patient presents with   Medical Management of Chronic Issues    HPI Patient is a 77 year old white female who presents for follow-up of hyperlipidemia, GERD.   Denies any significant problems.  Patient eats healthy.  Patient exercises.  She mows her lawn regularly.    Hyperlipidemia: she is on Lipitor 20 mg once daily at night and aspirin 81 mg once daily.  Osteoporosis: patient on calcium citrate, and Evista 60 mg once daily.    Patient has GERD and is currently taking omeprazole 40 mg daily.      08/17/2023    7:44 AM 09/01/2022    8:46 AM 08/15/2022    8:38 AM 07/26/2021    7:41 AM 05/03/2021   10:04 AM  Depression screen PHQ 2/9  Decreased Interest 0 0 0 0 0  Down, Depressed, Hopeless 0 0 0 0 0  PHQ - 2 Score 0 0 0 0 0  Altered sleeping 0      Tired, decreased energy 1      Change in appetite 3      Feeling bad or failure about yourself  0      Trouble concentrating 0      Moving slowly or fidgety/restless 0      Suicidal thoughts 0      PHQ-9 Score 4      Difficult doing work/chores Not difficult at all            08/17/2023    7:44 AM  Fall Risk   Falls in the past year? 0  Number falls in past yr: 0  Injury with Fall? 0  Risk for fall due to : Impaired balance/gait  Follow up Falls evaluation completed;Falls prevention discussed    Patient Care Team: Blane Ohara, MD as PCP - General (Family Medicine) Albin Felling, OD (Optometry) Genia Del, MD as Consulting Physician (Obstetrics and Gynecology)   Review of Systems  Constitutional:  Negative for chills, fatigue and fever.  HENT:  Negative for congestion, rhinorrhea and sore throat.   Respiratory:  Negative for cough and shortness of breath.   Cardiovascular:  Negative for chest pain.  Gastrointestinal:  Negative for abdominal pain, constipation, diarrhea, nausea and vomiting.   Genitourinary:  Negative for dysuria and urgency.  Musculoskeletal:  Positive for back pain. Negative for myalgias.       Right foot pain   Neurological:  Negative for dizziness, weakness, light-headedness and headaches.  Psychiatric/Behavioral:  Negative for dysphoric mood. The patient is not nervous/anxious.     Current Outpatient Medications on File Prior to Visit  Medication Sig Dispense Refill   aspirin 81 MG tablet Take 81 mg by mouth daily.     atorvastatin (LIPITOR) 20 MG tablet Take 1 tablet (20 mg total) by mouth at bedtime. 90 tablet 10   Biotin 5000 MCG CAPS Take 1 capsule by mouth daily.     Calcium Citrate-Vitamin D (CALCIUM + D PO) Take by mouth. Twice daily     Chlorpheniramine Maleate (CHLOR-TABLETS PO) Take 1 tablet by mouth as needed.     fish oil-omega-3 fatty acids 1000 MG capsule Take 1 g by mouth daily.     meloxicam (MOBIC) 7.5 MG tablet TAKE 1 TABLET TWICE A DAY AS NEEDED FOR SHOULDER PAIN 180 tablet 3   Multiple Vitamin (MULTIVITAMIN) capsule Take 1 capsule by mouth  daily.     Naproxen Sodium (ALEVE PO) Take by mouth as needed.     omeprazole (PRILOSEC) 40 MG capsule Take 1 capsule (40 mg total) by mouth daily. 90 capsule 3   raloxifene (EVISTA) 60 MG tablet Take 1 tablet (60 mg total) by mouth daily. 120 tablet 2   VITAMIN D PO Take 5,000 Int'l Units by mouth. 2-3 times weekly     No current facility-administered medications on file prior to visit.   Past Medical History:  Diagnosis Date   Cancer [facility]) 1989   breast cancer/ chemo therapy/ mastectomy and reconstruction   Chemotherapy adverse reaction    breast cancer-1989   Diverticulosis 2007   GERD (gastroesophageal reflux disease)    Hiatal hernia    HX: breast cancer 1989   Hyperplastic colon polyp 11/2001   Mixed hyperlipidemia    Osteoarthritis    Shingles 5/20   Past Surgical History:  Procedure Laterality Date   BREAST SURGERY  1989   reconstruction    bunion removed  01/2005   carpel  tunnel release  1994   COLONOSCOPY  2007   KNEE SURGERY Right    REPLACEMENT TOTAL KNEE Right 11/2008   ROTATOR CUFF REPAIR  08/2008    Family History  Problem Relation Age of Onset   Osteoarthritis Mother    Hypertension Mother    Heart failure Father    Cancer Brother        colon cancer   Cirrhosis Brother    Diabetes Brother    Heart attack Brother    Cancer Maternal Grandfather    Heart failure Paternal Grandmother    Breast cancer Niece    Bladder Cancer Nephew    Social History   Socioeconomic History   Marital status: Widowed    Spouse name: Not on file   Number of children: 2   Years of education: Not on file   Highest education level: Not on file  Occupational History   Not on file  Tobacco Use   Smoking status: Never   Smokeless tobacco: Never  Vaping Use   Vaping status: Never Used  Substance and Sexual Activity   Alcohol use: Not Currently   Drug use: Never   Sexual activity: Not Currently    Partners: Male    Birth control/protection: Post-menopausal, Abstinence  Other Topics Concern   Not on file  Social History Narrative   Patient lives near family and has close relationship   Social Determinants of Health   Financial Resource Strain: Not on file  Food Insecurity: No Food Insecurity (11/29/2020)   Hunger Vital Sign    Worried About Running Out of Food in the Last Year: Never true    Ran Out of Food in the Last Year: Never true  Transportation Needs: No Transportation Needs (11/29/2020)   PRAPARE - Administrator, Civil Service (Medical): No    Lack of Transportation (Non-Medical): No  Physical Activity: Not on file  Stress: Not on file  Social Connections: Not on file    Objective:  BP 132/64   Pulse 78   Temp (!) 97.1 F (36.2 C)   Resp 16   Ht 5\' 1"  (1.549 m)   Wt 179 lb (81.2 kg)   LMP 12/26/1987 Comment: not sexually active  BMI 33.82 kg/m      08/17/2023    7:33 AM 03/08/2023    9:49 AM 01/23/2023   11:03 AM   BP/Weight  Systolic BP 132  110 124  Diastolic BP 64 80 82  Wt. (Lbs) 179    BMI 33.82 kg/m2      Physical Exam Vitals reviewed.  Constitutional:      Appearance: Normal appearance. She is obese.  Neck:     Vascular: No carotid bruit.  Cardiovascular:     Rate and Rhythm: Normal rate and regular rhythm.     Heart sounds: Normal heart sounds.  Pulmonary:     Effort: Pulmonary effort is normal. No respiratory distress.     Breath sounds: Normal breath sounds.  Abdominal:     General: Abdomen is flat. Bowel sounds are normal.     Palpations: Abdomen is soft.     Tenderness: There is no abdominal tenderness.  Neurological:     Mental Status: She is alert and oriented to person, place, and time.  Psychiatric:        Mood and Affect: Mood normal.        Behavior: Behavior normal.     Diabetic Foot Exam - Simple   No data filed      Lab Results  Component Value Date   WBC 3.4 08/17/2023   HGB 11.9 08/17/2023   HCT 35.3 08/17/2023   PLT 250 08/17/2023   GLUCOSE 88 08/17/2023   CHOL 163 08/17/2023   TRIG 60 08/17/2023   HDL 82 08/17/2023   LDLCALC 69 08/17/2023   ALT 18 08/17/2023   AST 31 08/17/2023   NA 142 08/17/2023   K 4.8 08/17/2023   CL 103 08/17/2023   CREATININE 0.90 08/17/2023   BUN 15 08/17/2023   CO2 25 08/17/2023   TSH 3.030 07/26/2021   INR 1.0 12/17/2008      Assessment & Plan:    Age-related osteoporosis without current pathological fracture Assessment & Plan: Continue calcium citrate and Evista 60 mg once daily.  Orders: -     VITAMIN D 25 Hydroxy (Vit-D Deficiency, Fractures) -     AMB Referral to Pharmacy Medication Management  Mixed hyperlipidemia Assessment & Plan: Well controlled.  No changes to medicines. Continue on Lipitor 20 mg once daily at night and aspirin 81 mg once daily. Continue to work on eating a healthy diet and exercise.  Labs drawn today.    Orders: -     CBC with Differential/Platelet -     Comprehensive  metabolic panel -     Lipid panel  Gastroesophageal reflux disease with esophagitis without hemorrhage Assessment & Plan: Continue omeprazole 40 mg daily.    Encounter for hepatitis C screening test for low risk patient Assessment & Plan: Check labs  Orders: -     HCV Ab w Reflex to Quant PCR  Class 1 obesity due to excess calories with serious comorbidity and body mass index (BMI) of 33.0 to 33.9 in adult Assessment & Plan: Recommend continue to work on eating healthy diet and exercise.  Comorbidities: gerd, hyperlipidemai.   Other orders -     Interpretation:     No orders of the defined types were placed in this encounter.   Orders Placed This Encounter  Procedures   CBC with Differential/Platelet   Comprehensive metabolic panel   Lipid panel   VITAMIN D 25 Hydroxy (Vit-D Deficiency, Fractures)   HCV Ab w Reflex to Quant PCR   Interpretation:   AMB Referral to Pharmacy Medication Management     Follow-up: Return in about 1 year (around 08/16/2024) for chronic follow up, awv with Selena Batten .   Clayborn Bigness  I Leal-Borjas,acting as a scribe for Blane Ohara, MD.,have documented all relevant documentation on the behalf of Blane Ohara, MD,as directed by  Blane Ohara, MD while in the presence of Blane Ohara, MD.   An After Visit Summary was printed and given to the patient.  Blane Ohara, MD [facility] [phone]

## 2023-08-16 NOTE — Assessment & Plan Note (Signed)
Continue omeprazole 40 mg daily

## 2023-08-17 ENCOUNTER — Ambulatory Visit (INDEPENDENT_AMBULATORY_CARE_PROVIDER_SITE_OTHER): Payer: Medicare Other | Admitting: Family Medicine

## 2023-08-17 ENCOUNTER — Encounter: Payer: Self-pay | Admitting: Family Medicine

## 2023-08-17 VITALS — BP 132/64 | HR 78 | Temp 97.1°F | Resp 16 | Ht 61.0 in | Wt 179.0 lb

## 2023-08-17 DIAGNOSIS — K2100 Gastro-esophageal reflux disease with esophagitis, without bleeding: Secondary | ICD-10-CM | POA: Diagnosis not present

## 2023-08-17 DIAGNOSIS — M810 Age-related osteoporosis without current pathological fracture: Secondary | ICD-10-CM | POA: Diagnosis not present

## 2023-08-17 DIAGNOSIS — Z1159 Encounter for screening for other viral diseases: Secondary | ICD-10-CM | POA: Diagnosis not present

## 2023-08-17 DIAGNOSIS — Z6833 Body mass index (BMI) 33.0-33.9, adult: Secondary | ICD-10-CM | POA: Diagnosis not present

## 2023-08-17 DIAGNOSIS — E782 Mixed hyperlipidemia: Secondary | ICD-10-CM

## 2023-08-17 DIAGNOSIS — E6609 Other obesity due to excess calories: Secondary | ICD-10-CM

## 2023-08-18 LAB — COMPREHENSIVE METABOLIC PANEL
ALT: 18 IU/L (ref 0–32)
AST: 31 IU/L (ref 0–40)
Albumin: 4.4 g/dL (ref 3.8–4.8)
Alkaline Phosphatase: 77 IU/L (ref 44–121)
BUN/Creatinine Ratio: 17 (ref 12–28)
BUN: 15 mg/dL (ref 8–27)
Bilirubin Total: 0.4 mg/dL (ref 0.0–1.2)
CO2: 25 mmol/L (ref 20–29)
Calcium: 9.7 mg/dL (ref 8.7–10.3)
Chloride: 103 mmol/L (ref 96–106)
Creatinine, Ser: 0.9 mg/dL (ref 0.57–1.00)
Globulin, Total: 2 g/dL (ref 1.5–4.5)
Glucose: 88 mg/dL (ref 70–99)
Potassium: 4.8 mmol/L (ref 3.5–5.2)
Sodium: 142 mmol/L (ref 134–144)
Total Protein: 6.4 g/dL (ref 6.0–8.5)
eGFR: 67 mL/min/{1.73_m2} (ref >59)

## 2023-08-18 LAB — LIPID PANEL
Chol/HDL Ratio: 2 ratio (ref 0.0–4.4)
Cholesterol, Total: 163 mg/dL (ref 100–199)
HDL: 82 mg/dL (ref >39)
LDL Chol Calc (NIH): 69 mg/dL (ref 0–99)
Triglycerides: 60 mg/dL (ref 0–149)
VLDL Cholesterol Cal: 12 mg/dL (ref 5–40)

## 2023-08-18 LAB — CBC WITH DIFFERENTIAL/PLATELET
Basophils Absolute: 0 10*3/uL (ref 0.0–0.2)
Basos: 1 %
EOS (ABSOLUTE): 0.2 10*3/uL (ref 0.0–0.4)
Eos: 5 %
Hematocrit: 35.3 % (ref 34.0–46.6)
Hemoglobin: 11.9 g/dL (ref 11.1–15.9)
Immature Grans (Abs): 0 10*3/uL (ref 0.0–0.1)
Immature Granulocytes: 0 %
Lymphocytes Absolute: 1.3 10*3/uL (ref 0.7–3.1)
Lymphs: 39 %
MCH: 28.5 pg (ref 26.6–33.0)
MCHC: 33.7 g/dL (ref 31.5–35.7)
MCV: 85 fL (ref 79–97)
Monocytes Absolute: 0.5 10*3/uL (ref 0.1–0.9)
Monocytes: 14 %
Neutrophils Absolute: 1.4 10*3/uL (ref 1.4–7.0)
Neutrophils: 41 %
Platelets: 250 10*3/uL (ref 150–450)
RBC: 4.17 x10E6/uL (ref 3.77–5.28)
RDW: 13.7 % (ref 11.7–15.4)
WBC: 3.4 10*3/uL (ref 3.4–10.8)

## 2023-08-18 LAB — HCV AB W REFLEX TO QUANT PCR: HCV Ab: NONREACTIVE

## 2023-08-18 LAB — HCV INTERPRETATION

## 2023-08-18 LAB — VITAMIN D 25 HYDROXY (VIT D DEFICIENCY, FRACTURES): Vit D, 25-Hydroxy: 78.6 ng/mL (ref 30.0–100.0)

## 2023-08-18 NOTE — Assessment & Plan Note (Signed)
Check labs 

## 2023-08-18 NOTE — Assessment & Plan Note (Signed)
Recommend continue to work on eating healthy diet and exercise.  Comorbidities: gerd, hyperlipidemai.

## 2023-08-21 ENCOUNTER — Telehealth: Payer: Self-pay

## 2023-08-21 NOTE — Progress Notes (Signed)
   Care Guide Note  08/21/2023 Name: Melanie Hamilton MRN: [id number] DOB: 01-26-1946  Referred by: Blane Ohara, MD Reason for referral : Care Coordination (Outreach to schedule with Pharm d )   Melanie Hamilton is a 77 y.o. year old female who is a primary care patient of Cox, Kirsten, MD. [name] was referred to the pharmacist for assistance related to  Med assistance  .    An unsuccessful telephone outreach was attempted today to contact the patient who was referred to the pharmacy team for assistance with medication assistance. Additional attempts will be made to contact the patient.   Penne Lash, RMA Care Guide [facility]  [place], [place] [address] Direct Dial: [phone] [name]

## 2023-08-21 NOTE — Progress Notes (Signed)
   Care Guide Note  08/21/2023 Name: Melanie Hamilton MRN: [id number] DOB: 1946/01/27  Referred by: Blane Ohara, MD Reason for referral : Care Coordination (Outreach to schedule with Pharm d )   Melanie Hamilton is a 77 y.o. year old female who is a primary care patient of Cox, Kirsten, MD. Melanie Hamilton was referred to the pharmacist for assistance related to  Med assistance  .    Successful contact was made with the patient to discuss pharmacy services including being ready for the pharmacist to call at least 5 minutes before the scheduled appointment time, to have medication bottles and any blood sugar or blood pressure readings ready for review. The patient agreed to meet with the pharmacist via with the pharmacist via telephone visit on (date/time).  08/22/2023  Penne Lash, RMA Care Guide [facility]  [place], [place] [address] Direct Dial: [phone] [email or web address]

## 2023-08-22 ENCOUNTER — Other Ambulatory Visit: Payer: Medicare Other | Admitting: Pharmacist

## 2023-08-22 NOTE — Progress Notes (Signed)
08/22/2023 Name: Melanie Hamilton MRN: [id number] DOB: 1946/05/03   Melanie Hamilton is a 77 y.o. year old female who presented for a telephone visit.   They were referred to the pharmacist by their PCP for assistance in managing medication access- Evista cost.    Subjective:  Care Team: Primary Care Provider: Blane Ohara, MD    Medication Access/Adherence  Current Pharmacy:  [facility] [address] [place] - [address] [place] [address] Phone: [phone] Fax: [phone]  [facility] - [place], [place] - [address] [place] [address] Phone: [phone] Fax: [phone]  [facility] - [place], [place] - [address] [place] [address] Phone: [phone] Fax: [phone]   Patient reports affordability concerns with their medications: Yes , evista 60mg   Patient reports access/transportation concerns to their pharmacy: No  Patient reports adherence concerns with their medications:  No     Osteoporosis:  Current medications: evista 60mg  daily  Current supplements: calcium + vitamin D  Most recent DEXA: 01/2022  Current medication access support: [employer] for evista in the past. Patient states she has received her last refill (for 90D) free through [employer] and is wanting to get the renewal application underway.   Objective:   Lab Results  Component Value Date   CREATININE 0.90 08/17/2023   BUN 15 08/17/2023   NA 142 08/17/2023   K 4.8 08/17/2023   CL 103 08/17/2023   CO2 25 08/17/2023    Lab Results  Component Value Date   CHOL 163 08/17/2023   HDL 82 08/17/2023   LDLCALC 69 08/17/2023   TRIG 60 08/17/2023   CHOLHDL 2.0 08/17/2023    Medications Reviewed Today     Reviewed by Gabriel Carina, [facility] (Pharmacist) on 08/22/23 at 0913  Med List Status: <None>   Medication Order Taking? Sig Documenting  Provider Last Dose Status Informant  aspirin 81 MG tablet [id number] Yes Take 81 mg by mouth daily. [provider] Taking Active   atorvastatin (LIPITOR) 20 MG tablet [id number] Yes Take 1 tablet (20 mg total) by mouth at bedtime. Blane Ohara, MD Taking Active   Biotin 5000 MCG CAPS [id number] Yes Take 1 capsule by mouth daily. [provider] Taking Active   Calcium Citrate-Vitamin D (CALCIUM + D PO) [id number] Yes Take by mouth. Twice daily [provider] Taking Active   Chlorpheniramine Maleate (CHLOR-TABLETS PO) [id number] Yes Take 1 tablet by mouth as needed. [provider] Taking Active   fish oil-omega-3 fatty acids 1000 MG capsule [id number] Yes Take 1 g by mouth daily. [provider] Taking Active   meloxicam (MOBIC) 7.5 MG tablet [id number] Yes TAKE 1 TABLET TWICE A DAY AS NEEDED FOR SHOULDER PAIN Cox, Kirsten, MD Taking Active   Multiple Vitamin (MULTIVITAMIN) capsule [id number] Yes Take 1 capsule by mouth daily. [provider] Taking Active   Naproxen Sodium (ALEVE PO) [id number] Yes Take by mouth as needed. [provider] Taking Active   omeprazole (PRILOSEC) 40 MG capsule [id number] Yes Take 1 capsule (40 mg total) by mouth daily. Cox, Kirsten, MD Taking Active   raloxifene (EVISTA) 60 MG tablet [id number] Yes Take 1 tablet (60 mg total) by mouth daily. Blane Ohara, MD Taking Active   VITAMIN D PO [id number] Yes Take 5,000 Int'l Units by mouth. 2-3 times weekly [provider] Taking Active  Assessment/Plan:   Osteoporosis: - Currently appropriately managed - Reviewed recommendation for daily calcium intake of 1200 mg and vitamin D intake of [phone] units - Recommended to choose calcium citrate formulation due to concurrent acid reflux medication - Screened patient for Medicare LIS/Extra Help, patient exceeds income threshold - Will facilitate renewal application via [employer] for evista 60mg   daily, with our RX med assistance team. Educated patient on workflow for application and what to expect.   Follow Up Plan: 4 weeks for application process update  Lynnda Shields, PharmD, BCPS Clinical Pharmacist [facility]

## 2023-08-22 NOTE — Patient Instructions (Signed)
Setareh,  It was a pleasure speaking with you today! I will facilitate the application for Evista to come at no cost through the manufacturer. Keep an eye out in the mail.   I will schedule [place] a brief phone call in about 4 weeks time, just to make sure the application is on track. If you have questions about medications or need to reschedule, my number is [phone], feel free to leave a voicemail if I cannot answer at that time and I will get back to you.  Take care, Elmarie Shiley, PharmD, BCPS Clinical Pharmacist [facility]

## 2023-08-29 ENCOUNTER — Telehealth: Payer: Self-pay

## 2023-08-29 NOTE — Telephone Encounter (Signed)
-----   Message from Gabriel Carina sent at 08/22/2023  9:43 AM EDT ----- Hi,  Can you please send patient portion of [employer] application (+return labeling) for Evista 60mg  daily to patient, and provider portion to Dr. Sedalia Muta office with return fax instructions? Thank you! Thurston Hole

## 2023-08-29 NOTE — Telephone Encounter (Signed)
PAP: PAP application for EVISTA 60MG , [employer]) has been mailed to pt's home address on file. Will fax provider portion of application to provider's office when pt's portion is received.

## 2023-09-06 ENCOUNTER — Ambulatory Visit (INDEPENDENT_AMBULATORY_CARE_PROVIDER_SITE_OTHER): Payer: Medicare Other

## 2023-09-06 DIAGNOSIS — Z0000 Encounter for general adult medical examination without abnormal findings: Secondary | ICD-10-CM | POA: Diagnosis not present

## 2023-09-06 NOTE — Patient Instructions (Signed)
Melanie Hamilton , Thank you for taking time to come for your Medicare Wellness Visit. I appreciate your ongoing commitment to your health goals. Please review the following plan we discussed and let me know if I can assist you in the future.   This is a list of the screening recommended for you and due dates:  Health Maintenance  Topic Date Due   Flu Shot  07/26/2023   COVID-19 Vaccine (6 - 2023-24 season) 08/26/2023   Mammogram  02/16/2024   Medicare Annual Wellness Visit  09/05/2024   Colon Cancer Screening  09/22/2025   DTaP/Tdap/Td vaccine (4 - Td or Tdap) 08/13/2030   Pneumonia Vaccine  Completed   DEXA scan (bone density measurement)  Completed   Hepatitis C Screening  Completed   Zoster (Shingles) Vaccine  Completed   HPV Vaccine  Aged Out    Preventive Care 70 Years and Older, Female Preventive care refers to lifestyle choices and visits with your health care provider that can promote health and wellness. What does preventive care include? A yearly physical exam. This is also called an annual well check. Dental exams once or twice a year. Routine eye exams. Ask your health care provider how often you should have your eyes checked. Personal lifestyle choices, including: Daily care of your teeth and gums. Regular physical activity. Eating a healthy diet. Avoiding tobacco and drug use. Limiting alcohol use. Practicing safe sex. Taking low-dose aspirin every day. Taking vitamin and mineral supplements as recommended by your health care provider. What happens during an annual well check? The services and screenings done by your health care provider during your annual well check will depend on your age, overall health, lifestyle risk factors, and family history of disease. Counseling  Your health care provider may ask you questions about your: Alcohol use. Tobacco use. Drug use. Emotional well-being. Home and relationship well-being. Sexual activity. Eating habits. History  of falls. Memory and ability to understand (cognition). Work and work Astronomer. Reproductive health. Screening  You may have the following tests or measurements: Height, weight, and BMI. Blood pressure. Lipid and cholesterol levels. These may be checked every 5 years, or more frequently if you are over 2 years old. Skin check. Lung cancer screening. You may have this screening every year starting at age 70 if you have a 30-pack-year history of smoking and currently smoke or have quit within the past 15 years. Fecal occult blood test (FOBT) of the stool. You may have this test every year starting at age 20. Flexible sigmoidoscopy or colonoscopy. You may have a sigmoidoscopy every 5 years or a colonoscopy every 10 years starting at age 85. Hepatitis C blood test. Hepatitis B blood test. Sexually transmitted disease (STD) testing. Diabetes screening. This is done by checking your blood sugar (glucose) after you have not eaten for a while (fasting). You may have this done every 1-3 years. Bone density scan. This is done to screen for osteoporosis. You may have this done starting at age 25. Mammogram. This may be done every 1-2 years. Talk to your health care provider about how often you should have regular mammograms. Talk with your health care provider about your test results, treatment options, and if necessary, the need for more tests. Vaccines  Your health care provider may recommend certain vaccines, such as: Influenza vaccine. This is recommended every year. Tetanus, diphtheria, and acellular pertussis (Tdap, Td) vaccine. You may need a Td booster every 10 years. Zoster vaccine. You may need this after  age 8. Pneumococcal 13-valent conjugate (PCV13) vaccine. One dose is recommended after age 59. Pneumococcal polysaccharide (PPSV23) vaccine. One dose is recommended after age 62. Talk to your health care provider about which screenings and vaccines you need and how often you need  them. This information is not intended to replace advice given to you by your health care provider. Make sure you discuss any questions you have with your health care provider. Document Released: 01/07/2016 Document Revised: 08/30/2016 Document Reviewed: 10/12/2015 Elsevier Interactive Patient Education  2017 [employer].  Fall Prevention in the Home Falls can cause injuries. They can happen to people of all ages. There are many things you can do to make your home safe and to help prevent falls. What can I do on the outside of my home? Regularly fix the edges of walkways and driveways and fix any cracks. Remove anything that might make you trip as you walk through a door, such as a raised step or threshold. Trim any bushes or trees on the path to your home. Use bright outdoor lighting. Clear any walking paths of anything that might make someone trip, such as rocks or tools. Regularly check to see if handrails are loose or broken. Make sure that both sides of any steps have handrails. Any raised decks and porches should have guardrails on the edges. Have any leaves, snow, or ice cleared regularly. Use sand or salt on walking paths during winter. Clean up any spills in your garage right away. This includes oil or grease spills. What can I do in the bathroom? Use night lights. Install grab bars by the toilet and in the tub and shower. Do not use towel bars as grab bars. Use non-skid mats or decals in the tub or shower. If you need to sit down in the shower, use a plastic, non-slip stool. Keep the floor dry. Clean up any water that spills on the floor as soon as it happens. Remove soap buildup in the tub or shower regularly. Attach bath mats securely with double-sided non-slip rug tape. Do not have throw rugs and other things on the floor that can make you trip. What can I do in the bedroom? Use night lights. Make sure that you have a light by your bed that is easy to reach. Do not use  any sheets or blankets that are too big for your bed. They should not hang down onto the floor. Have a firm chair that has side arms. You can use this for support while you get dressed. Do not have throw rugs and other things on the floor that can make you trip. What can I do in the kitchen? Clean up any spills right away. Avoid walking on wet floors. Keep items that you use a lot in easy-to-reach places. If you need to reach something above you, use a strong step stool that has a grab bar. Keep electrical cords out of the way. Do not use floor polish or wax that makes floors slippery. If you must use wax, use non-skid floor wax. Do not have throw rugs and other things on the floor that can make you trip. What can I do with my stairs? Do not leave any items on the stairs. Make sure that there are handrails on both sides of the stairs and use them. Fix handrails that are broken or loose. Make sure that handrails are as long as the stairways. Check any carpeting to make sure that it is firmly attached to the stairs.  Fix any carpet that is loose or worn. Avoid having throw rugs at the top or bottom of the stairs. If you do have throw rugs, attach them to the floor with carpet tape. Make sure that you have a light switch at the top of the stairs and the bottom of the stairs. If you do not have them, ask someone to add them for you. What else can I do to help prevent falls? Wear shoes that: Do not have high heels. Have rubber bottoms. Are comfortable and fit you well. Are closed at the toe. Do not wear sandals. If you use a stepladder: Make sure that it is fully opened. Do not climb a closed stepladder. Make sure that both sides of the stepladder are locked into place. Ask someone to hold it for you, if possible. Clearly mark and make sure that you can see: Any grab bars or handrails. First and last steps. Where the edge of each step is. Use tools that help you move around (mobility aids)  if they are needed. These include: Canes. Walkers. Scooters. Crutches. Turn on the lights when you go into a dark area. Replace any light bulbs as soon as they burn out. Set up your furniture so you have a clear path. Avoid moving your furniture around. If any of your floors are uneven, fix them. If there are any pets around you, be aware of where they are. Review your medicines with your doctor. Some medicines can make you feel dizzy. This can increase your chance of falling. Ask your doctor what other things that you can do to help prevent falls. This information is not intended to replace advice given to you by your health care provider. Make sure you discuss any questions you have with your health care provider. Document Released: 10/07/2009 Document Revised: 05/18/2016 Document Reviewed: 01/15/2015 Elsevier Interactive Patient Education  2017 [employer].

## 2023-09-06 NOTE — Progress Notes (Signed)
Subjective:   Melanie Hamilton is a 77 y.o. female who presents for Medicare Annual (Subsequent) preventive examination.  This wellness visit is conducted by a nurse.  The patient's medications were reviewed and reconciled since the patient's last visit.  History details were provided by the patient.  The history appears to be reliable.  Medical History: Patient history and Family history was reviewed  Medications, Allergies, and preventative health maintenance was reviewed and updated.   Visit Complete: Virtual  I connected with  Melanie Hamilton on 09/06/23 by a audio enabled telemedicine application and verified that I am speaking with the correct person using two identifiers.  Patient Location: Home  Provider Location: Office/Clinic  I discussed the limitations of evaluation and management by telemedicine. The patient expressed understanding and agreed to proceed.  Patient Medicare AWV questionnaire was completed by the patient on 09/02/23; I have confirmed that all information answered by patient is correct and no changes since this date.  Cardiac Risk Factors include: advanced age (>77men, >38 women);obesity (BMI >30kg/m2)     Objective:    Today's Vitals   09/06/23 1205  PainSc: 0-No pain  Patient was unable to self-report due to a lack of equipment at home via telehealth  There is no height or weight on file to calculate BMI.     09/01/2022    8:44 AM 05/03/2021   10:01 AM 04/29/2020    9:21 AM  Advanced Directives  Does Patient Have a Medical Advance Directive? Yes Yes Yes  Type of Estate agent of [place];Living will Healthcare Power of [place];Living will Healthcare Power of [place];Living will  Does patient want to make changes to medical advance directive? No - Patient declined No - Patient declined No - Patient declined  Copy of Healthcare Power of Attorney in Chart? No - copy requested No - copy requested No - copy requested    Current  Medications (verified) Outpatient Encounter Medications as of 09/06/2023  Medication Sig   aspirin 81 MG tablet Take 81 mg by mouth daily.   atorvastatin (LIPITOR) 20 MG tablet Take 1 tablet (20 mg total) by mouth at bedtime.   Biotin 5000 MCG CAPS Take 1 capsule by mouth daily.   Calcium Citrate-Vitamin D (CALCIUM + D PO) Take by mouth. Twice daily   Chlorpheniramine Maleate (CHLOR-TABLETS PO) Take 1 tablet by mouth as needed.   meloxicam (MOBIC) 7.5 MG tablet TAKE 1 TABLET TWICE A DAY AS NEEDED FOR SHOULDER PAIN   Multiple Vitamin (MULTIVITAMIN) capsule Take 1 capsule by mouth daily.   Naproxen Sodium (ALEVE PO) Take by mouth as needed.   omeprazole (PRILOSEC) 40 MG capsule Take 1 capsule (40 mg total) by mouth daily.   raloxifene (EVISTA) 60 MG tablet Take 1 tablet (60 mg total) by mouth daily.   VITAMIN D PO Take 5,000 Int'l Units by mouth. 2-3 times weekly   No facility-administered encounter medications on file as of 09/06/2023.    Allergies (verified) Patient has no known allergies.   History: Past Medical History:  Diagnosis Date   Cancer [facility]) 1989   breast cancer/ chemo therapy/ mastectomy and reconstruction   Chemotherapy adverse reaction    breast cancer-1989   Diverticulosis 2007   GERD (gastroesophageal reflux disease)    Hiatal hernia    HX: breast cancer 1989   Hyperplastic colon polyp 11/2001   Mixed hyperlipidemia    Osteoarthritis    Shingles 5/20   Past Surgical History:  Procedure Laterality Date  BREAST SURGERY  1989   reconstruction    bunion removed  01/2005   carpel tunnel release  1994   COLONOSCOPY  2007   KNEE SURGERY Right    REPLACEMENT TOTAL KNEE Right 11/2008   ROTATOR CUFF REPAIR  08/2008   Family History  Problem Relation Age of Onset   Osteoarthritis Mother    Hypertension Mother    Heart failure Father    Cancer Brother        colon cancer   Cirrhosis Brother    Diabetes Brother    Heart attack Brother    Cancer Maternal  Grandfather    Heart failure Paternal Grandmother    Breast cancer Niece    Bladder Cancer Nephew    Social History   Socioeconomic History   Marital status: Widowed    Spouse name: Not on file   Number of children: 2   Years of education: Not on file   Highest education level: Not on file  Occupational History   Not on file  Tobacco Use   Smoking status: Never   Smokeless tobacco: Never  Vaping Use   Vaping status: Never Used  Substance and Sexual Activity   Alcohol use: Not Currently   Drug use: Never   Sexual activity: Not Currently    Partners: Male    Birth control/protection: Post-menopausal, Abstinence  Other Topics Concern   Not on file  Social History Narrative   Patient lives near family and has close relationship   Social Determinants of Health   Financial Resource Strain: Low Risk  (09/02/2023)   Overall Financial Resource Strain (CARDIA)    Difficulty of Paying Living Expenses: Not very hard  Food Insecurity: No Food Insecurity (09/02/2023)   Hunger Vital Sign    Worried About Running Out of Food in the Last Year: Never true    Ran Out of Food in the Last Year: Never true  Transportation Needs: No Transportation Needs (09/02/2023)   PRAPARE - Administrator, Civil Service (Medical): No    Lack of Transportation (Non-Medical): No  Physical Activity: Insufficiently Active (09/02/2023)   Exercise Vital Sign    Days of Exercise per Week: 3 days    Minutes of Exercise per Session: 30 min  Stress: No Stress Concern Present (09/06/2023)   [employer] of Occupational Health - Occupational Stress Questionnaire    Feeling of Stress : Not at all  Social Connections: Moderately Integrated (09/02/2023)   Social Connection and Isolation Panel [NHANES]    Frequency of Communication with Friends and Family: More than three times a week    Frequency of Social Gatherings with Friends and Family: More than three times a week    Attends Religious Services: More  than 4 times per year    Active Member of [employer] or Organizations: Yes    Attends Banker Meetings: More than 4 times per year    Marital Status: Widowed    Tobacco Counseling Counseling given: Not Answered   Clinical Intake:  Pre-visit preparation completed: Yes Pain : No/denies pain Pain Score: 0-No pain   BMI - recorded: 33.84 Nutritional Status: BMI > 30  Obese Nutritional Risks: None Diabetes: No How often do you need to have someone help you when you read instructions, pamphlets, or other written materials from your doctor or pharmacy?: 1 - Never Interpreter Needed?: No    Activities of Daily Living    09/02/2023   11:27 PM  In your present  state of health, do you have any difficulty performing the following activities:  Hearing? 0  Vision? 0  Difficulty concentrating or making decisions? 0  Walking or climbing stairs? 1  Dressing or bathing? 0  Doing errands, shopping? 0  Preparing Food and eating ? N  Using the Toilet? N  In the past six months, have you accidently leaked urine? N  Do you have problems with loss of bowel control? N  Managing your Medications? N  Managing your Finances? N  Housekeeping or managing your Housekeeping? N    Patient Care Team: Blane Ohara, MD as PCP - General (Family Medicine) Albin Felling, OD (Optometry) Genia Del, MD as Consulting Physician (Obstetrics and Gynecology)     Assessment:   This is a routine wellness examination for [place].  Hearing/Vision screen No results found.  Depression Screen    09/06/2023   12:07 PM 08/17/2023    7:44 AM 09/01/2022    8:46 AM 08/15/2022    8:38 AM 07/26/2021    7:41 AM 05/03/2021   10:04 AM 11/29/2020    9:23 AM  PHQ 2/9 Scores  PHQ - 2 Score 0 0 0 0 0 0 0  PHQ- 9 Score 3 4         Fall Risk    09/02/2023   11:27 PM 08/17/2023    7:44 AM 03/08/2023    9:50 AM 08/30/2022   11:15 AM 08/29/2022   12:39 PM  Fall Risk   Falls in the past year? 0 0 0 0 0  Number  falls in past yr: 0 0 0 0   Injury with Fall? 0 0 0 0   Risk for fall due to : Impaired balance/gait Impaired balance/gait No Fall Risks No Fall Risks   Follow up Falls evaluation completed;Education provided Falls evaluation completed;Falls prevention discussed Falls evaluation completed Falls evaluation completed;Falls prevention discussed     MEDICARE RISK AT HOME: Medicare Risk at Home Any stairs in or around the home?: Yes If so, are there any without handrails?: No Home free of loose throw rugs in walkways, pet beds, electrical cords, etc?: Yes Adequate lighting in your home to reduce risk of falls?: Yes Life alert?: No Use of a cane, walker or w/c?: No Grab bars in the bathroom?: No Shower chair or bench in shower?: No Elevated toilet seat or a handicapped toilet?: No  TIMED UP AND GO:  Was the test performed?  No    Cognitive Function:        09/06/2023   12:12 PM 09/01/2022    8:48 AM 05/03/2021   10:25 AM 04/29/2020    9:23 AM  6CIT Screen  What Year? 0 points 0 points 0 points 0 points  What month? 0 points 0 points 0 points 0 points  What time? 0 points 0 points 0 points 0 points  Count back from 20 0 points 0 points 0 points 0 points  Months in reverse 0 points 0 points 0 points 0 points  Repeat phrase 0 points 0 points 0 points 0 points  Total Score 0 points 0 points 0 points 0 points    Immunizations Immunization History  Administered Date(s) Administered   DTaP 12/25/2008   Influenza, High Dose Seasonal PF 10/16/2018, 10/18/2022   Influenza-Unspecified 12/25/2018, 10/12/2020, 10/29/2021   Moderna Sars-Covid-2 Vaccination 04/18/2021, 08/31/2021   PFIZER(Purple Top)SARS-COV-2 Vaccination 01/10/2020, 01/31/2020, 09/23/2020   Pneumococcal Conjugate-13 02/26/2015   Pneumococcal Polysaccharide-23 11/09/2017   Pneumococcal-Unspecified 12/25/2013   Rsv,  Bivalent, Protein Subunit Rsvpref,pf Verdis Frederickson) 10/29/2022   Td 02/15/2010   Tdap 08/13/2020   Zoster  Recombinant(Shingrix) 09/24/2017, 12/08/2017   Zoster, Live 10/21/2014    TDAP status: Up to date  Flu Vaccine status: Due, Education has been provided regarding the importance of this vaccine. Advised may receive this vaccine at local pharmacy or Health Dept. Aware to provide a copy of the vaccination record if obtained from local pharmacy or Health Dept. Verbalized acceptance and understanding.  Pneumococcal vaccine status: Up to date  Covid-19 vaccine status: Information provided on how to obtain vaccines.   Qualifies for Shingles Vaccine? Yes   Zostavax completed Yes   Shingrix Completed?: Yes  Screening Tests Health Maintenance  Topic Date Due   INFLUENZA VACCINE  07/26/2023   COVID-19 Vaccine (6 - 2023-24 season) 08/26/2023   Medicare Annual Wellness (AWV)  08/31/2023   MAMMOGRAM  02/16/2024   Colonoscopy  09/22/2025   DTaP/Tdap/Td (4 - Td or Tdap) 08/13/2030   Pneumonia Vaccine 32+ Years old  Completed   DEXA SCAN  Completed   Hepatitis C Screening  Completed   Zoster Vaccines- Shingrix  Completed   HPV VACCINES  Aged Out    Health Maintenance  Health Maintenance Due  Topic Date Due   INFLUENZA VACCINE  07/26/2023   COVID-19 Vaccine (6 - 2023-24 season) 08/26/2023   Medicare Annual Wellness (AWV)  08/31/2023    Colorectal cancer screening: No longer required.   Mammogram status: Completed 01/2023. Repeat every year  Bone Density status: Completed 01/2022. Results reflect: Bone density results: OSTEOPOROSIS. Repeat every 2 years.  Lung Cancer Screening: (Low Dose CT Chest recommended if Age 60-80 years, 20 pack-year currently smoking OR have quit w/in 15years.) does not qualify.   Additional Screening:  Vision Screening: Recommended annual ophthalmology exams for early detection of glaucoma and other disorders of the eye. Is the patient up to date with their annual eye exam?  Yes  Who is the provider or what is the name of the office in which the patient  attends annual eye exams? [facility]  Dental Screening: Recommended annual dental exams for proper oral hygiene  Community Resource Referral / Chronic Care Management: CRR required this visit?  No   CCM required this visit?  No     Plan:    1- Flu & COVID vaccine - patient will get from [facility]  I have personally reviewed and noted the following in the patient's chart:   Medical and social history Use of alcohol, tobacco or illicit drugs  Current medications and supplements including opioid prescriptions. Patient is not currently taking opioid prescriptions. Functional ability and status Nutritional status Physical activity Advanced directives List of other physicians Hospitalizations, surgeries, and ER visits in previous 12 months Vitals Screenings to include cognitive, depression, and falls Referrals and appointments  In addition, I have reviewed and discussed with patient certain preventive protocols, quality metrics, and best practice recommendations. A written personalized care plan for preventive services as well as general preventive health recommendations were provided to patient.     Jacklynn Bue, LPN   1/61/0960   After Visit Summary: (MyChart) Due to this being a telephonic visit, the after visit summary with patients personalized plan was offered to patient via MyChart

## 2023-09-19 NOTE — Telephone Encounter (Signed)
Rec'd completed patient portion of application.  Faxed pcp pages to provider for completion.

## 2023-10-02 NOTE — Telephone Encounter (Signed)
All portions completed.  Submitted application for EVISTA 60MG  to [employer] for patient assistance.   Phone: [phone]

## 2023-10-03 DIAGNOSIS — Z23 Encounter for immunization: Secondary | ICD-10-CM | POA: Diagnosis not present

## 2023-10-08 NOTE — Telephone Encounter (Signed)
Per patient, application submitted too early. Can resubmit after 10/09/23.

## 2023-10-15 NOTE — Telephone Encounter (Signed)
Received notification from [facility] [employer] regarding approval for EVISTA. Patient assistance approved from 12/26/23 to 12/24/24.  Medication will ship to patients home  Pt ID: [id number]  Company phone: [phone]

## 2023-11-20 ENCOUNTER — Other Ambulatory Visit: Payer: Self-pay | Admitting: Family Medicine

## 2023-12-11 ENCOUNTER — Other Ambulatory Visit: Payer: Self-pay | Admitting: Family Medicine

## 2024-02-21 ENCOUNTER — Encounter: Payer: Self-pay | Admitting: Family Medicine

## 2024-02-21 DIAGNOSIS — Z1231 Encounter for screening mammogram for malignant neoplasm of breast: Secondary | ICD-10-CM | POA: Diagnosis not present

## 2024-02-21 LAB — HM MAMMOGRAPHY

## 2024-03-20 DIAGNOSIS — H524 Presbyopia: Secondary | ICD-10-CM | POA: Diagnosis not present

## 2024-03-20 DIAGNOSIS — H18413 Arcus senilis, bilateral: Secondary | ICD-10-CM | POA: Diagnosis not present

## 2024-03-20 DIAGNOSIS — H43812 Vitreous degeneration, left eye: Secondary | ICD-10-CM | POA: Diagnosis not present

## 2024-03-20 DIAGNOSIS — H25013 Cortical age-related cataract, bilateral: Secondary | ICD-10-CM | POA: Diagnosis not present

## 2024-03-20 DIAGNOSIS — H2513 Age-related nuclear cataract, bilateral: Secondary | ICD-10-CM | POA: Diagnosis not present

## 2024-04-30 ENCOUNTER — Telehealth: Payer: Self-pay

## 2024-04-30 NOTE — Telephone Encounter (Signed)
 Please call this patient to schedule the next AWV appointment with Delford Felling, FNP Last date of AWV: 09/06/2023

## 2024-04-30 NOTE — Telephone Encounter (Signed)
 Contacted [facility] Linker T Seiple to schedule their annual wellness visit. Appointment made for 09/09/24.

## 2024-05-27 ENCOUNTER — Other Ambulatory Visit: Payer: Self-pay | Admitting: Family Medicine

## 2024-08-19 ENCOUNTER — Ambulatory Visit: Payer: Medicare Other | Admitting: Family Medicine

## 2024-08-19 ENCOUNTER — Encounter: Payer: Self-pay | Admitting: Family Medicine

## 2024-08-19 ENCOUNTER — Ambulatory Visit (INDEPENDENT_AMBULATORY_CARE_PROVIDER_SITE_OTHER): Admitting: Family Medicine

## 2024-08-19 ENCOUNTER — Other Ambulatory Visit: Payer: Self-pay | Admitting: Family Medicine

## 2024-08-19 VITALS — BP 130/80 | HR 70 | Temp 98.2°F | Ht 61.0 in | Wt 167.0 lb

## 2024-08-19 DIAGNOSIS — M810 Age-related osteoporosis without current pathological fracture: Secondary | ICD-10-CM

## 2024-08-19 DIAGNOSIS — Z6831 Body mass index (BMI) 31.0-31.9, adult: Secondary | ICD-10-CM | POA: Insufficient documentation

## 2024-08-19 DIAGNOSIS — E782 Mixed hyperlipidemia: Secondary | ICD-10-CM | POA: Diagnosis not present

## 2024-08-19 DIAGNOSIS — K2100 Gastro-esophageal reflux disease with esophagitis, without bleeding: Secondary | ICD-10-CM

## 2024-08-19 NOTE — Assessment & Plan Note (Signed)
Continue calcium citrate and Evista 60 mg once daily.

## 2024-08-19 NOTE — Assessment & Plan Note (Signed)
 Continue omeprazole 40 mg daily

## 2024-08-19 NOTE — Assessment & Plan Note (Signed)
 Recommend continue to work on eating healthy diet and exercise.

## 2024-08-19 NOTE — Assessment & Plan Note (Signed)
 Well controlled.  No changes to medicines. Continue on Lipitor 20 mg once daily at night and aspirin 81 mg once daily. Continue to work on eating a healthy diet and exercise.

## 2024-08-19 NOTE — Progress Notes (Signed)
 Subjective:  Patient ID: Melanie Hamilton, female    DOB: May 03, 1946  Age: 77 y.o. MRN: [id number]  Chief Complaint  Patient presents with   Medical Management of Chronic Issues    HPI: Discussed the use of AI scribe software for clinical note transcription with the patient, who gave verbal consent to proceed.  History of Present Illness   Melanie Hamilton is a 78 year old female who presents for a regular follow-up visit.  Shoulder injury and musculoskeletal symptoms - Sustained a shoulder injury while moving a refrigerator - Initial limitation in exercise due to shoulder pain - Tried ibuprofen for pain management but found meloxicam  more effective after shoulder improvement - Currently taking meloxicam  for over athritis.  - Has resumed exercising three days a week and performing yard work  Weight loss and nutritional intake - Weight decreased from 179 pounds a year ago to 167 pounds currently - Diet includes Cheerios, fruits, peanut butter and toast for breakfast, occasionally a biscuit - Frequently skips lunch when working - Eats early dinners, often consisting of sandwiches and home-cooked green beans from her garden - Exercises at the [employer]  Hyperlipidemia - Takes atorvastatin  20 mg before bed for lipid management  Gastroesophageal reflux disease - Takes omeprazole  for management of reflux symptoms  Osteoporosis and breast cancer history - Takes Evista  for osteoporosis - Previously took tamoxifen due to breast cancer  Panic attack-like episodes - Experienced two episodes resembling panic attacks, characterized by a hot flash sensation up the back of the neck few months ago. First one she was upset prior to onset. The second did not seem to have a trigger.  - No associated chest pain, dyspnea, or paresthesia      08/19/2024   10:50 AM 09/06/2023   12:07 PM 08/17/2023    7:44 AM 09/01/2022    8:46 AM 08/15/2022    8:38 AM  Depression screen PHQ 2/9  Decreased Interest  0 0 0 0 0  Down, Depressed, Hopeless 0 0 0 0 0  PHQ - 2 Score 0 0 0 0 0  Altered sleeping 0 0 0    Tired, decreased energy 1 1 1     Change in appetite 0 2 3    Feeling bad or failure about yourself  0 0 0    Trouble concentrating 0 0 0    Moving slowly or fidgety/restless 0 0 0    Suicidal thoughts 0 0 0    PHQ-9 Score 1 3 4     Difficult doing work/chores Not difficult at all Not difficult at all Not difficult at all          09/02/2023   11:27 PM  Fall Risk   Falls in the past year? 0  Number falls in past yr: 0  Injury with Fall? 0  Risk for fall due to : Impaired balance/gait  Follow up Falls evaluation completed;Education provided    Patient Care Team: Sherre Clapper, MD as PCP - General (Family Medicine) Erasmo Bernardino BRAVO, OD (Optometry) Lavoie, Marie-Lyne, MD as Consulting Physician (Obstetrics and Gynecology)   Review of Systems  Constitutional:  Negative for chills, fatigue and fever.  HENT:  Negative for congestion, ear pain and sore throat.   Respiratory:  Negative for cough and shortness of breath.   Cardiovascular:  Negative for chest pain.  Gastrointestinal:  Negative for abdominal pain, constipation, diarrhea, nausea and vomiting.  Genitourinary:  Negative for dysuria and urgency.  Musculoskeletal:  Negative for arthralgias  and myalgias.  Skin:  Negative for rash.  Neurological:  Negative for dizziness and headaches.  Psychiatric/Behavioral:  Negative for dysphoric mood. The patient is not nervous/anxious.     Current Outpatient Medications on File Prior to Visit  Medication Sig Dispense Refill   aspirin 81 MG tablet Take 81 mg by mouth daily.     atorvastatin  (LIPITOR) 20 MG tablet TAKE 1 TABLET AT BEDTIME 90 tablet 3   Biotin 5000 MCG CAPS Take 1 capsule by mouth daily.     Calcium  Citrate-Vitamin D  (CALCIUM  + D PO) Take by mouth. Twice daily     Chlorpheniramine Maleate (CHLOR-TABLETS PO) Take 1 tablet by mouth as needed.     meloxicam  (MOBIC ) 7.5 MG tablet  TAKE 1 TABLET TWICE A DAY AS NEEDED FOR SHOULDER PAIN 180 tablet 3   Multiple Vitamin (MULTIVITAMIN) capsule Take 1 capsule by mouth daily.     Naproxen Sodium (ALEVE PO) Take by mouth as needed.     omeprazole  (PRILOSEC) 40 MG capsule TAKE 1 CAPSULE DAILY 90 capsule 3   raloxifene  (EVISTA ) 60 MG tablet TAKE 1 TABLET BY MOUTH ONCE DAILY 120 tablet 2   VITAMIN D  PO Take 5,000 Int'l Units by mouth. 2-3 times weekly     No current facility-administered medications on file prior to visit.   Past Medical History:  Diagnosis Date   Cancer [facility]) 1989   breast cancer/ chemo therapy/ mastectomy and reconstruction   Chemotherapy adverse reaction    breast cancer-1989   Diverticulosis 2007   GERD (gastroesophageal reflux disease)    Hiatal hernia    HX: breast cancer 1989   Hyperplastic colon polyp 11/2001   Mixed hyperlipidemia    Osteoarthritis    Shingles 5/20   Past Surgical History:  Procedure Laterality Date   BREAST SURGERY  1989   reconstruction    bunion removed  01/2005   carpel tunnel release  1994   COLONOSCOPY  2007   KNEE SURGERY Right    REPLACEMENT TOTAL KNEE Right 11/2008   ROTATOR CUFF REPAIR  08/2008    Family History  Problem Relation Age of Onset   Osteoarthritis Mother    Hypertension Mother    Heart failure Father    Cancer Brother        colon cancer   Cirrhosis Brother    Diabetes Brother    Heart attack Brother    Cancer Maternal Grandfather    Heart failure Paternal Grandmother    Breast cancer Niece    Bladder Cancer Nephew    Social History   Socioeconomic History   Marital status: Widowed    Spouse name: Not on file   Number of children: 2   Years of education: Not on file   Highest education level: 12th grade  Occupational History   Not on file  Tobacco Use   Smoking status: Never   Smokeless tobacco: Never  Vaping Use   Vaping status: Never Used  Substance and Sexual Activity   Alcohol use: Not Currently   Drug use: Never   Sexual  activity: Not Currently    Partners: Male    Birth control/protection: Post-menopausal, Abstinence  Other Topics Concern   Not on file  Social History Narrative   Patient lives near family and has close relationship   Social Drivers of Corporate investment banker Strain: Low Risk  (08/15/2024)   Overall Financial Resource Strain (CARDIA)    Difficulty of Paying Living Expenses: Not very hard  Food Insecurity: No Food Insecurity (08/15/2024)   Hunger Vital Sign    Worried About Running Out of Food in the Last Year: Never true    Ran Out of Food in the Last Year: Never true  Transportation Needs: No Transportation Needs (08/15/2024)   PRAPARE - Administrator, Civil Service (Medical): No    Lack of Transportation (Non-Medical): No  Physical Activity: Sufficiently Active (08/15/2024)   Exercise Vital Sign    Days of Exercise per Week: 5 days    Minutes of Exercise per Session: 60 min  Stress: No Stress Concern Present (08/15/2024)   [employer] of Occupational Health - Occupational Stress Questionnaire    Feeling of Stress: Only a little  Social Connections: Moderately Integrated (08/15/2024)   Social Connection and Isolation Panel    Frequency of Communication with Friends and Family: More than three times a week    Frequency of Social Gatherings with Friends and Family: More than three times a week    Attends Religious Services: More than 4 times per year    Active Member of [employer] or Organizations: Yes    Attends Banker Meetings: 1 to 4 times per year    Marital Status: Widowed    Objective:  BP 130/80   Pulse 70   Temp 98.2 F (36.8 C)   Ht 5' 1 (1.549 m)   Wt 167 lb (75.8 kg)   LMP 12/26/1987 Comment: not sexually active  SpO2 98%   BMI 31.55 kg/m      08/19/2024   10:48 AM 08/17/2023    7:33 AM 03/08/2023    9:49 AM  BP/Weight  Systolic BP 130 132 110  Diastolic BP 80 64 80  Wt. (Lbs) 167 179   BMI 31.55 kg/m2 33.82 kg/m2      Physical Exam Vitals reviewed.  Constitutional:      Appearance: Normal appearance. She is normal weight.  Neck:     Vascular: No carotid bruit.  Cardiovascular:     Rate and Rhythm: Normal rate and regular rhythm.     Heart sounds: Normal heart sounds.  Pulmonary:     Effort: Pulmonary effort is normal. No respiratory distress.     Breath sounds: Normal breath sounds.  Abdominal:     General: Abdomen is flat. Bowel sounds are normal.     Palpations: Abdomen is soft.     Tenderness: There is no abdominal tenderness.  Neurological:     Mental Status: She is alert and oriented to person, place, and time.  Psychiatric:        Mood and Affect: Mood normal.        Behavior: Behavior normal.         Lab Results  Component Value Date   WBC 3.4 08/17/2023   HGB 11.9 08/17/2023   HCT 35.3 08/17/2023   PLT 250 08/17/2023   GLUCOSE 88 08/17/2023   CHOL 163 08/17/2023   TRIG 60 08/17/2023   HDL 82 08/17/2023   LDLCALC 69 08/17/2023   ALT 18 08/17/2023   AST 31 08/17/2023   NA 142 08/17/2023   K 4.8 08/17/2023   CL 103 08/17/2023   CREATININE 0.90 08/17/2023   BUN 15 08/17/2023   CO2 25 08/17/2023   TSH 3.030 07/26/2021   INR 1.0 12/17/2008      Assessment & Plan:  Mixed hyperlipidemia Assessment & Plan: Well controlled.  No changes to medicines. Continue on Lipitor 20 mg once daily at  night and aspirin 81 mg once daily. Continue to work on eating a healthy diet and exercise.    Orders: -     CBC with Differential/Platelet; Future -     Comprehensive metabolic panel with GFR; Future -     Lipid panel; Future  Age-related osteoporosis without current pathological fracture Assessment & Plan: Continue calcium  citrate and Evista  60 mg once daily.  Orders: -     DG Bone Density; Future  Gastroesophageal reflux disease with esophagitis without hemorrhage Assessment & Plan: Continue omeprazole  40 mg daily.    Body mass index (BMI) 31.0-31.9,  adult Assessment & Plan: Recommend continue to work on eating healthy diet and exercise.           No orders of the defined types were placed in this encounter.   Orders Placed This Encounter  Procedures   DG Bone Density   CBC with Differential/Platelet   Comprehensive metabolic panel with GFR   Lipid panel     Follow-up: Return in about 3 weeks (around 09/09/2024) for Dr. Sirivol, awv - pt needs to get labs then as we have no lab tech today.SABRA   An After Visit Summary was printed and given to the patient.  Abigail Free, MD [facility] [phone]

## 2024-09-09 ENCOUNTER — Ambulatory Visit (INDEPENDENT_AMBULATORY_CARE_PROVIDER_SITE_OTHER)

## 2024-09-09 VITALS — BP 138/82 | HR 75 | Temp 97.7°F | Ht 61.0 in | Wt 166.0 lb

## 2024-09-09 DIAGNOSIS — Z0000 Encounter for general adult medical examination without abnormal findings: Secondary | ICD-10-CM | POA: Insufficient documentation

## 2024-09-09 DIAGNOSIS — M810 Age-related osteoporosis without current pathological fracture: Secondary | ICD-10-CM | POA: Diagnosis not present

## 2024-09-09 DIAGNOSIS — E782 Mixed hyperlipidemia: Secondary | ICD-10-CM

## 2024-09-09 NOTE — Progress Notes (Signed)
 Subjective:   Melanie Hamilton is a 78 y.o. female who presents for Medicare Annual (Subsequent) preventive examination.  Visit Complete: In person  Patient Medicare AWV questionnaire was completed by the patient today; I have confirmed that all information answered by patient is correct and no changes since this date.  Cardiac Risk Factors include: advanced age (>47men, >37 women);obesity (BMI >30kg/m2)     Objective:    Today's Vitals   09/09/24 0908  BP: 138/82  Pulse: 75  Temp: 97.7 F (36.5 C)  TempSrc: Temporal  SpO2: 98%  Weight: 166 lb (75.3 kg)  Height: 5' 1 (1.549 m)   Body mass index is 31.37 kg/m.     09/09/2024    9:00 AM 09/01/2022    8:44 AM 05/03/2021   10:01 AM 04/29/2020    9:21 AM  Advanced Directives  Does Patient Have a Medical Advance Directive? Yes Yes Yes Yes  Type of Estate agent of [place];Living will Healthcare Power of [place];Living will Healthcare Power of [place];Living will Healthcare Power of [place];Living will  Does patient want to make changes to medical advance directive?  No - Patient declined No - Patient declined No - Patient declined  Copy of Healthcare Power of Attorney in Chart?  No - copy requested No - copy requested No - copy requested    Current Medications (verified) Outpatient Encounter Medications as of 09/09/2024  Medication Sig   aspirin 81 MG tablet Take 81 mg by mouth daily.   atorvastatin  (LIPITOR) 20 MG tablet TAKE 1 TABLET AT BEDTIME   Biotin 5000 MCG CAPS Take 1 capsule by mouth daily.   Calcium  Citrate-Vitamin D  (CALCIUM  + D PO) Take by mouth. Twice daily   Chlorpheniramine Maleate (CHLOR-TABLETS PO) Take 1 tablet by mouth as needed.   meloxicam  (MOBIC ) 7.5 MG tablet TAKE 1 TABLET TWICE A DAY AS NEEDED FOR SHOULDER PAIN   Multiple Vitamin (MULTIVITAMIN) capsule Take 1 capsule by mouth daily.   Naproxen Sodium (ALEVE PO) Take by mouth as needed.   Omega-3 Fatty Acids (FISH OIL BURP-LESS)  1200 MG CAPS Take 1,200 mg by mouth daily.   omeprazole  (PRILOSEC) 40 MG capsule TAKE 1 CAPSULE DAILY   raloxifene  (EVISTA ) 60 MG tablet TAKE 1 TABLET BY MOUTH ONCE DAILY   VITAMIN D  PO Take 5,000 Int'l Units by mouth. 2-3 times weekly   No facility-administered encounter medications on file as of 09/09/2024.    Allergies (verified) Patient has no known allergies.   History: Past Medical History:  Diagnosis Date   Cancer ([facility]) 1989   breast cancer/ chemo therapy/ mastectomy and reconstruction   Cataract 2022   Chemotherapy adverse reaction    breast cancer-1989   Diverticulosis 2007   GERD (gastroesophageal reflux disease)    Hiatal hernia    HX: breast cancer 1989   Hyperplastic colon polyp 11/2001   Mixed hyperlipidemia    Osteoarthritis    Shingles 04/2019   Past Surgical History:  Procedure Laterality Date   BREAST SURGERY  12/26/1987   reconstruction    bunion removed  01/25/2005   carpel tunnel release  12/25/1992   COLONOSCOPY  12/25/2005   JOINT REPLACEMENT  2009   Knee   KNEE SURGERY Right    REPLACEMENT TOTAL KNEE Right 11/24/2008   ROTATOR CUFF REPAIR  08/25/2008   TUBAL LIGATION  1978   Family History  Problem Relation Age of Onset   Osteoarthritis Mother    Hypertension Mother    Heart  failure Father    Heart disease Father    Cancer Brother        colon cancer   Cirrhosis Brother    Diabetes Brother    Heart attack Brother    Cancer Maternal Grandfather    Heart failure Paternal Grandmother    Breast cancer Niece    Bladder Cancer Nephew    Social History   Socioeconomic History   Marital status: Widowed    Spouse name: Not on file   Number of children: 2   Years of education: Not on file   Highest education level: 12th grade  Occupational History   Not on file  Tobacco Use   Smoking status: Never   Smokeless tobacco: Never  Vaping Use   Vaping status: Never Used  Substance and Sexual Activity   Alcohol use: Never   Drug use:  Never   Sexual activity: Not Currently    Partners: Male    Birth control/protection: Post-menopausal, Abstinence  Other Topics Concern   Not on file  Social History Narrative   Patient lives near family and has close relationship   Social Drivers of Corporate investment banker Strain: Low Risk  (08/15/2024)   Overall Financial Resource Strain (CARDIA)    Difficulty of Paying Living Expenses: Not very hard  Food Insecurity: No Food Insecurity (08/15/2024)   Hunger Vital Sign    Worried About Running Out of Food in the Last Year: Never true    Ran Out of Food in the Last Year: Never true  Transportation Needs: No Transportation Needs (08/15/2024)   PRAPARE - Transportation    Lack of Transportation (Medical): No    Lack of Transportation (Non-Medical): No  Physical Activity: Sufficiently Active (08/15/2024)   Exercise Vital Sign    Days of Exercise per Week: 5 days    Minutes of Exercise per Session: 60 min  Stress: No Stress Concern Present (08/15/2024)   [employer] of Occupational Health - Occupational Stress Questionnaire    Feeling of Stress: Only a little  Social Connections: Moderately Integrated (08/15/2024)   Social Connection and Isolation Panel    Frequency of Communication with Friends and Family: More than three times a week    Frequency of Social Gatherings with Friends and Family: More than three times a week    Attends Religious Services: More than 4 times per year    Active Member of [employer] or Organizations: Yes    Attends Banker Meetings: 1 to 4 times per year    Marital Status: Widowed    Tobacco Counseling Counseling given: Not Answered   Clinical Intake:     Pain : No/denies pain     BMI - recorded: 31.37 Nutritional Status: BMI > 30  Obese  How often do you need to have someone help you when you read instructions, pamphlets, or other written materials from your doctor or pharmacy?: 1 - Never         Activities of Daily  Living    09/07/2024    7:54 AM  In your present state of health, do you have any difficulty performing the following activities:  Hearing? 0  Vision? 0  Difficulty concentrating or making decisions? 0  Walking or climbing stairs? 0  Dressing or bathing? 0  Doing errands, shopping? 0  Preparing Food and eating ? N  Using the Toilet? N  In the past six months, have you accidently leaked urine? Y  Do you have problems with  loss of bowel control? N  Managing your Medications? N  Managing your Finances? N  Housekeeping or managing your Housekeeping? N    Patient Care Team: Sherre Clapper, MD as PCP - General (Family Medicine) Erasmo Bernardino BRAVO, OD (Optometry) Lavoie, Marie-Lyne, MD as Consulting Physician (Obstetrics and Gynecology)  Indicate any recent Medical Services you may have received from other than [facility] providers in the past year (date may be approximate).     Assessment:   This is a routine wellness examination for [place].  Hearing/Vision screen No results found.   Goals Addressed   None    Depression Screen    08/19/2024   10:50 AM 09/06/2023   12:07 PM 08/17/2023    7:44 AM 09/01/2022    8:46 AM 08/15/2022    8:38 AM 07/26/2021    7:41 AM 05/03/2021   10:04 AM  PHQ 2/9 Scores  PHQ - 2 Score 0 0 0 0 0 0 0  PHQ- 9 Score 1 3 4         Fall Risk    09/07/2024    7:54 AM 09/02/2023   11:27 PM 08/17/2023    7:44 AM 03/08/2023    9:50 AM 08/30/2022   11:15 AM  Fall Risk   Falls in the past year? 0 0 0 0 0  Number falls in past yr: 0 0 0 0 0  Injury with Fall? 0 0 0 0 0  Risk for fall due to : No Fall Risks Impaired balance/gait Impaired balance/gait No Fall Risks No Fall Risks  Follow up Falls evaluation completed Falls evaluation completed;Education provided Falls evaluation completed;Falls prevention discussed Falls evaluation completed Falls evaluation completed;Falls prevention discussed      Data saved with a previous flowsheet row definition    MEDICARE RISK AT  HOME: Medicare Risk at Home Any stairs in or around the home?: (Patient-Rptd) Yes If so, are there any without handrails?: (Patient-Rptd) No Home free of loose throw rugs in walkways, pet beds, electrical cords, etc?: (Patient-Rptd) Yes Adequate lighting in your home to reduce risk of falls?: (Patient-Rptd) Yes Life alert?: (Patient-Rptd) No Use of a cane, walker or w/c?: (Patient-Rptd) No Grab bars in the bathroom?: (Patient-Rptd) No Shower chair or bench in shower?: (Patient-Rptd) No Elevated toilet seat or a handicapped toilet?: (Patient-Rptd) Yes  TIMED UP AND GO:  Was the test performed?  Yes  Length of time to ambulate 10 feet: 40 sec Gait steady and fast without use of assistive device    Cognitive Function:        09/09/2024    9:01 AM 09/06/2023   12:12 PM 09/01/2022    8:48 AM 05/03/2021   10:25 AM 04/29/2020    9:23 AM  6CIT Screen  What Year? 0 points 0 points 0 points 0 points 0 points  What month? 0 points 0 points 0 points 0 points 0 points  What time? 0 points 0 points 0 points 0 points 0 points  Count back from 20 0 points 0 points 0 points 0 points 0 points  Months in reverse 0 points 0 points 0 points 0 points 0 points  Repeat phrase 0 points 0 points 0 points 0 points 0 points  Total Score 0 points 0 points 0 points 0 points 0 points    Immunizations Immunization History  Administered Date(s) Administered    sv, Bivalent, Protein Subunit Rsvpref,pf (Abrysvo) 10/29/2022   DTaP 12/25/2008   INFLUENZA, HIGH DOSE SEASONAL PF 10/16/2018, 10/18/2022, 10/03/2023  Influenza-Unspecified 12/25/2018, 10/12/2020, 10/29/2021   Moderna Sars-Covid-2 Vaccination 04/18/2021, 08/31/2021   PFIZER(Purple Top)SARS-COV-2 Vaccination 01/10/2020, 01/31/2020, 09/23/2020   Pfizer(Comirnaty)Fall Seasonal Vaccine 12 years and older 10/03/2023   Pneumococcal Conjugate-13 02/26/2015   Pneumococcal Polysaccharide-23 11/09/2017   Pneumococcal-Unspecified 12/25/2013   Td 02/15/2010    Tdap 08/13/2020   Zoster Recombinant(Shingrix) 09/24/2017, 12/08/2017   Zoster, Live 10/21/2014      Screening Tests Health Maintenance  Topic Date Due   COVID-19 Vaccine (7 - 2024-25 season) 08/25/2024   Influenza Vaccine  10/23/2024 (Originally 07/25/2024)   Mammogram  02/20/2025   Medicare Annual Wellness (AWV)  09/09/2025   Colonoscopy  09/22/2025   DTaP/Tdap/Td (4 - Td or Tdap) 08/13/2030   Pneumococcal Vaccine: 50+ Years  Completed   DEXA SCAN  Completed   Hepatitis C Screening  Completed   Zoster Vaccines- Shingrix  Completed   HPV VACCINES  Aged Out   Meningococcal B Vaccine  Aged Out    Health Maintenance  Health Maintenance Due  Topic Date Due   COVID-19 Vaccine (7 - 2024-25 season) 08/25/2024      Vision Screening: Recommended annual ophthalmology exams for early detection of glaucoma and other disorders of the eye. Is the patient up to date with their annual eye exam?  No  Who is the provider or what is the name of the office in which the patient attends annual eye exams?  If pt is not established with a provider, would they like to be referred to a provider to establish care? No .   Dental Screening: Recommended annual dental exams for proper oral hygiene    Community Resource Referral / Chronic Care Management: CRR required this visit?  No   CCM required this visit?  No     Plan:   Encounter for Medicare annual wellness exam Assessment & Plan: Routine adult wellness visit. Vital signs are stable with well-controlled blood pressure and heart rate. Manages daily activities independently. No depression, falls, smoking, or alcohol use. Regular family visits. Memory intact. Last blood work a year ago was normal. Needs assistance with refilling Evista  medication.  - Order basic labs - Schedule next Medicare wellness visit in one year  General Health Maintenance Flu shot to be administered by niece, a Teacher, early years/pre, at [employer]. Mammogram last done in  February 2025, next due in February 2026. Eye exam completed in February 2025. Dental exam completed once this year. - Schedule mammogram for February 2026  Goals of Care Advanced directives are in place.    Age-related osteoporosis without current pathological fracture -     DG Bone Density; Future -     VITAMIN D  25 Hydroxy (Vit-D Deficiency, Fractures)  Mixed hyperlipidemia    Assessment & Plan   Assessment and Plan          I have personally reviewed and noted the following in the patient's chart:   Medical and social history Use of alcohol, tobacco or illicit drugs  Current medications and supplements including opioid prescriptions. Patient is not currently taking opioid prescriptions. Functional ability and status Nutritional status Physical activity Advanced directives List of other physicians Hospitalizations, surgeries, and ER visits in previous 12 months Vitals Screenings to include cognitive, depression, and falls Referrals and appointments  In addition, I have reviewed and discussed with patient certain preventive protocols, quality metrics, and best practice recommendations. A written personalized care plan for preventive services as well as general preventive health recommendations were provided to patient.  After Visit Summary: (In Person-Printed) AVS printed and given to the patient

## 2024-09-09 NOTE — Assessment & Plan Note (Signed)
 Routine adult wellness visit. Vital signs are stable with well-controlled blood pressure and heart rate. Manages daily activities independently. No depression, falls, smoking, or alcohol use. Regular family visits. Memory intact. Last blood work a year ago was normal. Needs assistance with refilling Evista  medication.  - Order basic labs - Schedule next Medicare wellness visit in one year  General Health Maintenance Flu shot to be administered by niece, a Teacher, early years/pre, at [employer]. Mammogram last done in February 2025, next due in February 2026. Eye exam completed in February 2025. Dental exam completed once this year. - Schedule mammogram for February 2026  Goals of Care Advanced directives are in place.

## 2024-09-09 NOTE — Patient Instructions (Signed)
  Ms. Stringfield , Thank you for taking time to come for your Medicare Wellness Visit. I appreciate your ongoing commitment to your health goals. Please review the following plan we discussed and let me know if I can assist you in the future.   These are the goals we discussed:  Goals      Advanced Care Planning complete by next visit     Patient has Advance Directive, she will bring in to office to put on file     Have 3 meals a day     Eat three balanced meals daily, trying not to eat after 7 pm.     Increase physical activity     Walking daily     Pharmacy Care Plan     CARE PLAN ENTRY (see longitudinal plan of care for additional care plan information)  Current Barriers:  Chronic Disease Management support, education, and care coordination needs related to Hyperlipidemia and Osteoporosis   Hyperlipidemia Lab Results  Component Value Date/Time   LDLCALC 78 06/09/2020 08:40 AM   Pharmacist Clinical Goal(s): Over the next 90 days, patient will work with PharmD and providers to maintain LDL goal < 100 Current regimen:  aspirin 81 mg daily  Atorvastatin  20 mg daily  Fish oil omega 3 1000 mg daily Interventions: Discussed diet and lifestyle.  Reviewed most recent lipid panel and adherence to medications.  Patient self care activities - Over the next 90 days, patient will: Continue taking medications as prescribed.   Osteoporosis Pharmacist Clinical Goal(s) Over the next 90 days, patient will work with PharmD and providers to reduce risks of osteoporosis Current regimen:  calcium  600 mg am and bedtime Vitamin D  1000 units daily  Evista  60 mg daily  Interventions: Reviewed patient's medications.  Discussed importance of muscle strengthening and weight bearing exercise.  Contacting [employer] to request a tier exception to make raloxifene  more affordable.  Patient self care activities - Over the next 90 days, patient will: Continue current medications and supplements.  Continue  to stay active and complete weight bearing exercise as tolerated.   Medication management Pharmacist Clinical Goal(s): Over the next 90 days, patient will work with PharmD and providers to maintain optimal medication adherence Current pharmacy: [facility] Interventions Comprehensive medication review performed. Continue current medication management strategy Patient self care activities - Over the next 90 days, patient will: Focus on medication adherence by using pill box. Take medications as prescribed Report any questions or concerns to PharmD and/or provider(s)  Initial goal documentation      Prevent falls        This is a list of the screening recommended for you and due dates:  Health Maintenance  Topic Date Due   COVID-19 Vaccine (7 - 2024-25 season) 08/25/2024   Flu Shot  10/23/2024*   Breast Cancer Screening  02/20/2025   Medicare Annual Wellness Visit  09/09/2025   Colon Cancer Screening  09/22/2025   DTaP/Tdap/Td vaccine (4 - Td or Tdap) 08/13/2030   Pneumococcal Vaccine for age over 42  Completed   DEXA scan (bone density measurement)  Completed   Hepatitis C Screening  Completed   Zoster (Shingles) Vaccine  Completed   HPV Vaccine  Aged Out   Meningitis B Vaccine  Aged Out  *Topic was postponed. The date shown is not the original due date.

## 2024-09-10 ENCOUNTER — Telehealth: Payer: Self-pay | Admitting: Family Medicine

## 2024-09-10 ENCOUNTER — Other Ambulatory Visit: Payer: Self-pay

## 2024-09-10 DIAGNOSIS — E782 Mixed hyperlipidemia: Secondary | ICD-10-CM

## 2024-09-10 NOTE — Telephone Encounter (Unsigned)
 Copied from CRM #[id number]. Topic: Clinical - Lab/Test Results >> Sep 10, 2024  9:16 AM Tiffini S wrote: Reason for CRM: Patient has a lab appointment for 09/12/24- she is not able to come in for the lab appointment. Asked for the lab order to be sent to  [facility]. [address] [place], [address], [place] Phone: [phone]  Patient is asking for a call back on when the order has bern sent at [phone].

## 2024-09-10 NOTE — Telephone Encounter (Signed)
 Spoke with patient, verbalized understanding and had no questions at this time. Patient going tomorrow.

## 2024-09-11 DIAGNOSIS — M810 Age-related osteoporosis without current pathological fracture: Secondary | ICD-10-CM | POA: Diagnosis not present

## 2024-09-11 DIAGNOSIS — E782 Mixed hyperlipidemia: Secondary | ICD-10-CM | POA: Diagnosis not present

## 2024-09-12 ENCOUNTER — Ambulatory Visit: Payer: Self-pay

## 2024-09-12 ENCOUNTER — Other Ambulatory Visit

## 2024-09-12 LAB — COMPREHENSIVE METABOLIC PANEL WITH GFR
ALT: 18 IU/L (ref 0–32)
AST: 30 IU/L (ref 0–40)
Albumin: 4.4 g/dL (ref 3.8–4.8)
Alkaline Phosphatase: 78 IU/L (ref 49–135)
BUN/Creatinine Ratio: 25 (ref 12–28)
BUN: 20 mg/dL (ref 8–27)
Bilirubin Total: 0.7 mg/dL (ref 0.0–1.2)
CO2: 23 mmol/L (ref 20–29)
Calcium: 9.8 mg/dL (ref 8.7–10.3)
Chloride: 101 mmol/L (ref 96–106)
Creatinine, Ser: 0.8 mg/dL (ref 0.57–1.00)
Globulin, Total: 1.9 g/dL (ref 1.5–4.5)
Glucose: 87 mg/dL (ref 70–99)
Potassium: 4.1 mmol/L (ref 3.5–5.2)
Sodium: 140 mmol/L (ref 134–144)
Total Protein: 6.3 g/dL (ref 6.0–8.5)
eGFR: 76 mL/min/1.73 (ref >59)

## 2024-09-12 LAB — CBC WITH DIFFERENTIAL/PLATELET
Basophils Absolute: 0 x10E3/uL (ref 0.0–0.2)
Basos: 1 %
EOS (ABSOLUTE): 0.2 x10E3/uL (ref 0.0–0.4)
Eos: 5 %
Hematocrit: 41.3 % (ref 34.0–46.6)
Hemoglobin: 14.2 g/dL (ref 11.1–15.9)
Immature Grans (Abs): 0 x10E3/uL (ref 0.0–0.1)
Immature Granulocytes: 0 %
Lymphocytes Absolute: 1.3 x10E3/uL (ref 0.7–3.1)
Lymphs: 32 %
MCH: 32.2 pg (ref 26.6–33.0)
MCHC: 34.4 g/dL (ref 31.5–35.7)
MCV: 94 fL (ref 79–97)
Monocytes Absolute: 0.5 x10E3/uL (ref 0.1–0.9)
Monocytes: 12 %
Neutrophils Absolute: 2 x10E3/uL (ref 1.4–7.0)
Neutrophils: 50 %
Platelets: 229 x10E3/uL (ref 150–450)
RBC: 4.41 x10E6/uL (ref 3.77–5.28)
RDW: 13.5 % (ref 11.7–15.4)
WBC: 4 x10E3/uL (ref 3.4–10.8)

## 2024-09-12 LAB — LIPID PANEL
Chol/HDL Ratio: 1.9 ratio (ref 0.0–4.4)
Cholesterol, Total: 156 mg/dL (ref 100–199)
HDL: 82 mg/dL (ref >39)
LDL Chol Calc (NIH): 63 mg/dL (ref 0–99)
Triglycerides: 49 mg/dL (ref 0–149)
VLDL Cholesterol Cal: 11 mg/dL (ref 5–40)

## 2024-09-12 LAB — VITAMIN D 25 HYDROXY (VIT D DEFICIENCY, FRACTURES): Vit D, 25-Hydroxy: 85.7 ng/mL (ref 30.0–100.0)

## 2024-09-14 ENCOUNTER — Ambulatory Visit: Payer: Self-pay | Admitting: Family Medicine

## 2024-09-17 ENCOUNTER — Other Ambulatory Visit: Payer: Self-pay | Admitting: Family Medicine

## 2024-09-30 ENCOUNTER — Telehealth: Payer: Self-pay

## 2024-09-30 ENCOUNTER — Other Ambulatory Visit (HOSPITAL_COMMUNITY): Payer: Self-pay

## 2024-09-30 NOTE — Telephone Encounter (Signed)
 Pharmacy Patient Advocate Encounter  Insurance verification completed.   The patient is insured through [employer] test claim for raloxifene . Currently a quantity of 30 is a 30 day supply and the co-pay is $7.80 .   This test claim was processed through [facility]- copay amounts may vary at other pharmacies due to pharmacy/plan contracts, or as the patient moves through the different stages of their insurance plan.

## 2024-09-30 NOTE — Telephone Encounter (Signed)
 Spoke with pt.to let her know [employer] will no longer being offering patient assistance for Evista  since a generic is available.  Ran test claim and her copay is $7.63 for 30 day supply.

## 2024-09-30 NOTE — Telephone Encounter (Signed)
 Copied from CRM [id number]. Topic: Clinical - Medication Question >> Sep 29, 2024  4:31 PM Joesph B wrote: Reason for CRM: patient is calling in regards to EVISTA  60 MG tablet. She wants to know if [employer] cares) will cover her medications through the patients assistance program. She spoke to [employer] a pharmacy tech and wants to speak to her again.

## 2024-10-07 DIAGNOSIS — Z23 Encounter for immunization: Secondary | ICD-10-CM | POA: Diagnosis not present

## 2024-11-12 ENCOUNTER — Telehealth: Payer: Self-pay

## 2024-11-12 NOTE — Telephone Encounter (Signed)
 Copied from CRM [id number]. Topic: Clinical - Medication Question >> Sep 29, 2024  4:31 PM Joesph B wrote: Reason for CRM: patient is calling in regards to EVISTA  60 MG tablet. She wants to know if [employer] cares) will cover her medications through the patients assistance program. She spoke to [employer] a pharmacy tech and wants to speak to her again. >> Nov 11, 2024  1:29 PM Larissa S wrote: Patient states she is changing her insurance for next year and would like to know the alternative for EVISTA  60 MG tablet  and cost. Patient is requesting a callback after 4pm as she may not be able to get to her phone until then.

## 2024-11-17 ENCOUNTER — Other Ambulatory Visit (HOSPITAL_COMMUNITY): Payer: Self-pay

## 2024-12-24 NOTE — Progress Notes (Unsigned)
 "  78 y.o. G14P0002 female with hx of breast cancer here for problem visit. Widowed.  Patient's last menstrual period was 12/26/1987.   She reports worsening bladder prolapse. Now feels prolapse outside- worse with standing, walking, cooking, exercise- all of which she enjoys doing.  She is unable to completely empty her bladder. Tissue feels extremely dry and she notices a trace of blood at times Symptoms have gotten worse since Thanksgiving.  She is also noticing lower back pain with walking a lot.   Her husband passed from complications due to mesh. She would like to avoid mesh surgeries if possible.  OB History  Gravida Para Term Preterm AB Living  2 2 0 0 0 2  SAB IAB Ectopic Multiple Live Births  0 0 0 0 2    # Outcome Date GA Lbr Len/2nd Weight Sex Type Anes PTL Lv  2 Para 1977    M Vag-Spont   LIV  1 Para 1974    M Vag-Spont   LIV   Past Medical History:  Diagnosis Date   Cancer (HCC) 1989   breast cancer/ chemo therapy/ mastectomy and reconstruction   Cataract 2022   Chemotherapy adverse reaction    breast cancer-1989   Diverticulosis 2007   GERD (gastroesophageal reflux disease)    Hiatal hernia    HX: breast cancer 1989   Hyperplastic colon polyp 11/2001   Mixed hyperlipidemia    Osteoarthritis    Shingles 04/2019   Past Surgical History:  Procedure Laterality Date   BREAST SURGERY  12/26/1987   reconstruction    bunion removed  01/25/2005   carpel tunnel release  12/25/1992   COLONOSCOPY  12/25/2005   JOINT REPLACEMENT  2009   Knee   KNEE SURGERY Right    REPLACEMENT TOTAL KNEE Right 11/24/2008   ROTATOR CUFF REPAIR  08/25/2008   TUBAL LIGATION  1978   Medications Ordered Prior to Encounter[1] Allergies[2]    PE Today's Vitals   12/26/24 0916  BP: 126/62  Pulse: 85  Temp: 97.9 F (36.6 C)  TempSrc: Oral  SpO2: 92%  Weight: 163 lb (73.9 kg)   Body mass index is 30.8 kg/m.  Physical Exam Vitals reviewed. Exam conducted with a chaperone  present.  Constitutional:      General: She is not in acute distress.    Appearance: Normal appearance.  HENT:     Head: Normocephalic and atraumatic.     Nose: Nose normal.  Eyes:     Extraocular Movements: Extraocular movements intact.     Conjunctiva/sclera: Conjunctivae normal.  Pulmonary:     Effort: Pulmonary effort is normal.  Genitourinary:    General: Normal vulva.     Exam position: Lithotomy position.     Vagina: Prolapsed vaginal walls present. No vaginal discharge.     Cervix: Normal. No cervical motion tenderness, discharge or lesion.     Uterus: Normal. With uterine prolapse. Not enlarged and not tender.      Adnexa: Right adnexa normal and left adnexa normal.     Comments: Stage 3 apical prolapse, stage 2 cystocele Musculoskeletal:        General: Normal range of motion.     Cervical back: Normal range of motion.  Neurological:     General: No focal deficit present.     Mental Status: She is alert.  Psychiatric:        Mood and Affect: Mood normal.        Behavior: Behavior normal.  Bladder catheterization: Verbal consent was obtained. Bladder catheterization was performed after prepping the urethral opening with hibiclens with a flexible 25fr catheter. 150cc was emptied from the bladder. Specimen was collected for Ucx.  Assessment and Plan:        Difficulty voiding -     Urine Culture -     Urinalysis  Cystocele with third degree uterine prolapse -     Pessary/other intravaginal support device insert/fit; Future  Normal PVR Discussed POP. For symptomatic prolapse, she can consider pessary, vaginal estrace or surgical management. She elects for pessary.  She is undecided about estrace due to hx of breast cancer.  Safety information reviewed. Plan for use of vaseline at this time.  Vera LULLA Pa, MD       [1]  Current Outpatient Medications on File Prior to Visit  Medication Sig Dispense Refill   aspirin 81 MG tablet Take 81 mg by mouth  daily.     atorvastatin  (LIPITOR) 20 MG tablet TAKE 1 TABLET AT BEDTIME 90 tablet 3   Biotin 5000 MCG CAPS Take 1 capsule by mouth daily.     Calcium  Citrate-Vitamin D  (CALCIUM  + D PO) Take by mouth. Twice daily     Chlorpheniramine Maleate (CHLOR-TABLETS PO) Take 1 tablet by mouth as needed.     EVISTA  60 MG tablet TAKE 1 TABLET BY MOUTH ONCE DAILY 120 tablet 0   meloxicam  (MOBIC ) 7.5 MG tablet TAKE 1 TABLET TWICE A DAY AS NEEDED FOR SHOULDER PAIN 180 tablet 3   Multiple Vitamin (MULTIVITAMIN) capsule Take 1 capsule by mouth daily.     Naproxen Sodium (ALEVE PO) Take by mouth as needed.     Omega-3 Fatty Acids (FISH OIL BURP-LESS) 1200 MG CAPS Take 1,200 mg by mouth daily.     omeprazole  (PRILOSEC) 40 MG capsule TAKE 1 CAPSULE DAILY 90 capsule 3   VITAMIN D  PO Take 5,000 Int'l Units by mouth. 2-3 times weekly     No current facility-administered medications on file prior to visit.  [2] No Known Allergies  "

## 2024-12-26 ENCOUNTER — Encounter: Payer: Self-pay | Admitting: Obstetrics and Gynecology

## 2024-12-26 ENCOUNTER — Ambulatory Visit (INDEPENDENT_AMBULATORY_CARE_PROVIDER_SITE_OTHER): Admitting: Obstetrics and Gynecology

## 2024-12-26 VITALS — BP 126/62 | HR 85 | Temp 97.9°F | Wt 163.0 lb

## 2024-12-26 DIAGNOSIS — R32 Unspecified urinary incontinence: Secondary | ICD-10-CM | POA: Diagnosis not present

## 2024-12-26 DIAGNOSIS — N813 Complete uterovaginal prolapse: Secondary | ICD-10-CM | POA: Diagnosis not present

## 2024-12-26 DIAGNOSIS — R39198 Other difficulties with micturition: Secondary | ICD-10-CM | POA: Diagnosis not present

## 2024-12-26 LAB — URINALYSIS
Bilirubin Urine: NEGATIVE
Glucose, UA: NEGATIVE
Ketones, ur: NEGATIVE
Nitrite: NEGATIVE
Protein, ur: NEGATIVE
Specific Gravity, Urine: 1.015 (ref 1.001–1.035)
pH: 7.5 (ref 5.0–8.0)

## 2024-12-26 NOTE — Patient Instructions (Signed)
 Considering applying aquaphor or coconut oil to the vulva after showers. You could also using daily vaginal moisturizers by brands like Good Clean Love and Ah! Yes.

## 2024-12-27 LAB — URINE CULTURE
MICRO NUMBER:: 17419464
Result:: NO GROWTH
SPECIMEN QUALITY:: ADEQUATE

## 2024-12-29 ENCOUNTER — Ambulatory Visit: Payer: Self-pay | Admitting: Obstetrics and Gynecology

## 2025-01-06 ENCOUNTER — Ambulatory Visit: Payer: Self-pay | Admitting: Obstetrics and Gynecology

## 2025-01-08 ENCOUNTER — Ambulatory Visit: Payer: Self-pay | Admitting: Obstetrics and Gynecology

## 2025-02-04 ENCOUNTER — Ambulatory Visit: Payer: Self-pay | Admitting: Obstetrics and Gynecology

## 2025-02-11 ENCOUNTER — Ambulatory Visit: Payer: Self-pay | Admitting: Obstetrics and Gynecology

## 2025-02-12 ENCOUNTER — Ambulatory Visit: Payer: Self-pay | Admitting: Obstetrics and Gynecology
